# Patient Record
Sex: Female | Born: 1975 | Race: White | Hispanic: No | Marital: Married | Smoking: Former smoker
Health system: Southern US, Community
[De-identification: ages and names within clinical notes are randomized; demographics above are authoritative.]

## PROBLEM LIST (undated history)

## (undated) DIAGNOSIS — E785 Hyperlipidemia, unspecified: Secondary | ICD-10-CM

## (undated) DIAGNOSIS — M1990 Unspecified osteoarthritis, unspecified site: Secondary | ICD-10-CM

## (undated) DIAGNOSIS — G44229 Chronic tension-type headache, not intractable: Secondary | ICD-10-CM

## (undated) DIAGNOSIS — G43909 Migraine, unspecified, not intractable, without status migrainosus: Secondary | ICD-10-CM

## (undated) DIAGNOSIS — Z8619 Personal history of other infectious and parasitic diseases: Secondary | ICD-10-CM

## (undated) DIAGNOSIS — R519 Headache, unspecified: Secondary | ICD-10-CM

## (undated) DIAGNOSIS — N83202 Unspecified ovarian cyst, left side: Secondary | ICD-10-CM

## (undated) DIAGNOSIS — N390 Urinary tract infection, site not specified: Secondary | ICD-10-CM

## (undated) DIAGNOSIS — Z87891 Personal history of nicotine dependence: Secondary | ICD-10-CM

## (undated) DIAGNOSIS — Z8669 Personal history of other diseases of the nervous system and sense organs: Secondary | ICD-10-CM

## (undated) DIAGNOSIS — T7840XA Allergy, unspecified, initial encounter: Secondary | ICD-10-CM

## (undated) DIAGNOSIS — R51 Headache: Secondary | ICD-10-CM

## (undated) DIAGNOSIS — N83201 Unspecified ovarian cyst, right side: Secondary | ICD-10-CM

## (undated) DIAGNOSIS — F419 Anxiety disorder, unspecified: Secondary | ICD-10-CM

## (undated) HISTORY — DX: Unspecified ovarian cyst, right side: N83.201

## (undated) HISTORY — DX: Unspecified ovarian cyst, left side: N83.202

## (undated) HISTORY — DX: Unspecified osteoarthritis, unspecified site: M19.90

## (undated) HISTORY — DX: Headache, unspecified: R51.9

## (undated) HISTORY — DX: Hyperlipidemia, unspecified: E78.5

## (undated) HISTORY — PX: WISDOM TOOTH EXTRACTION: SHX21

## (undated) HISTORY — DX: Personal history of other diseases of the nervous system and sense organs: Z86.69

## (undated) HISTORY — DX: Allergy, unspecified, initial encounter: T78.40XA

## (undated) HISTORY — DX: Migraine, unspecified, not intractable, without status migrainosus: G43.909

## (undated) HISTORY — DX: Personal history of other infectious and parasitic diseases: Z86.19

## (undated) HISTORY — DX: Headache: R51

## (undated) HISTORY — DX: Urinary tract infection, site not specified: N39.0

## (undated) HISTORY — DX: Chronic tension-type headache, not intractable: G44.229

---

## 1998-02-11 ENCOUNTER — Emergency Department (HOSPITAL_COMMUNITY): Admission: EM | Admit: 1998-02-11 | Discharge: 1998-02-11 | Payer: Self-pay | Admitting: Emergency Medicine

## 2001-12-10 ENCOUNTER — Emergency Department (HOSPITAL_COMMUNITY): Admission: EM | Admit: 2001-12-10 | Discharge: 2001-12-11 | Payer: Self-pay | Admitting: Emergency Medicine

## 2001-12-11 ENCOUNTER — Emergency Department (HOSPITAL_COMMUNITY): Admission: EM | Admit: 2001-12-11 | Discharge: 2001-12-11 | Payer: Self-pay | Admitting: Emergency Medicine

## 2003-04-24 ENCOUNTER — Other Ambulatory Visit: Admission: RE | Admit: 2003-04-24 | Discharge: 2003-04-24 | Payer: Self-pay | Admitting: Family Medicine

## 2004-12-04 ENCOUNTER — Other Ambulatory Visit: Admission: RE | Admit: 2004-12-04 | Discharge: 2004-12-04 | Payer: Self-pay | Admitting: Family Medicine

## 2006-04-07 ENCOUNTER — Other Ambulatory Visit: Admission: RE | Admit: 2006-04-07 | Discharge: 2006-04-07 | Payer: Self-pay | Admitting: Family Medicine

## 2007-08-26 ENCOUNTER — Other Ambulatory Visit: Admission: RE | Admit: 2007-08-26 | Discharge: 2007-08-26 | Payer: Self-pay | Admitting: Family Medicine

## 2008-09-18 ENCOUNTER — Other Ambulatory Visit: Admission: RE | Admit: 2008-09-18 | Discharge: 2008-09-18 | Payer: Self-pay | Admitting: Family Medicine

## 2009-12-23 ENCOUNTER — Other Ambulatory Visit: Admission: RE | Admit: 2009-12-23 | Discharge: 2009-12-23 | Payer: Self-pay | Admitting: Family Medicine

## 2011-10-19 ENCOUNTER — Other Ambulatory Visit (HOSPITAL_COMMUNITY): Payer: Self-pay | Admitting: Family Medicine

## 2011-10-19 ENCOUNTER — Ambulatory Visit (HOSPITAL_COMMUNITY)
Admission: RE | Admit: 2011-10-19 | Discharge: 2011-10-19 | Disposition: A | Discharge status: Home / Self Care | Payer: BC Managed Care – PPO | Source: Ambulatory Visit | Attending: Family Medicine | Admitting: Family Medicine

## 2011-10-19 ENCOUNTER — Inpatient Hospital Stay (HOSPITAL_COMMUNITY)
Admission: AD | Admit: 2011-10-19 | Discharge: 2011-10-19 | Disposition: A | Discharge status: Home / Self Care | Payer: BC Managed Care – PPO | Source: Ambulatory Visit | Attending: Family Medicine | Admitting: Family Medicine

## 2011-10-19 DIAGNOSIS — Z3201 Encounter for pregnancy test, result positive: Secondary | ICD-10-CM

## 2011-10-19 DIAGNOSIS — N939 Abnormal uterine and vaginal bleeding, unspecified: Secondary | ICD-10-CM

## 2011-10-19 DIAGNOSIS — O209 Hemorrhage in early pregnancy, unspecified: Secondary | ICD-10-CM | POA: Insufficient documentation

## 2011-10-19 NOTE — MAU Note (Signed)
Radiology here for pt, was scheduled for outpt [place].

## 2011-10-19 NOTE — MAU Note (Signed)
Out pt only..on our board in error.

## 2011-11-18 ENCOUNTER — Other Ambulatory Visit: Payer: Self-pay | Admitting: Obstetrics and Gynecology

## 2013-03-28 ENCOUNTER — Other Ambulatory Visit: Payer: Self-pay | Admitting: Obstetrics and Gynecology

## 2013-05-25 ENCOUNTER — Encounter: Payer: Self-pay | Admitting: Neurology

## 2013-05-26 ENCOUNTER — Ambulatory Visit (INDEPENDENT_AMBULATORY_CARE_PROVIDER_SITE_OTHER): Payer: 59 | Admitting: Neurology

## 2013-05-26 ENCOUNTER — Encounter: Payer: Self-pay | Admitting: Neurology

## 2013-05-26 ENCOUNTER — Encounter (INDEPENDENT_AMBULATORY_CARE_PROVIDER_SITE_OTHER): Payer: Self-pay

## 2013-05-26 VITALS — BP 107/75 | HR 76 | Ht 64.0 in | Wt 149.0 lb

## 2013-05-26 DIAGNOSIS — R51 Headache: Secondary | ICD-10-CM

## 2013-05-26 DIAGNOSIS — R519 Headache, unspecified: Secondary | ICD-10-CM | POA: Insufficient documentation

## 2013-05-26 NOTE — Progress Notes (Signed)
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 PCP Amber Hendricks for evaluation of neck stiffness, headaches  She reported a history of occasionally headaches in the past, but only intermittent, over the past month, since February 2015, she has been having frequent almost daily right parietal area transient sharp pain, was no visual loss, short lasting  She also compalins of neck stiffness, radiating to bilateral shoulder,  She has been working out recently, also receiving acupuncture regularly, she used to take Xanax, now she was given clonazepam 0 point 5 mg 1 to have tablets as needed,   She also complains of stress, clonazepam does helps, but she wears off 2 hours later,  She also complains of episodes of bending over, during, she had fainting sensation,as if she is going to pass out.  She denies gait difficulty, no visual loss  REVIEW OF SYSTEMS: Full 14 system review of systems performed and notable only for anxiety, dizziness, headaches, memory loss, feeling cold, energy, constipation, spinning sensation, chest pain,  ALLERGIES: Allergies  Allergen Reactions  . Other     [place] Surgical lubricant----vaginal burning    HOME MEDICATIONS: Current Outpatient Prescriptions on File Prior to Visit  Medication Sig Dispense Refill  . acetaminophen (TYLENOL) 325 MG tablet Take 650 mg by mouth every 6 (six) hours as needed.      . cyclobenzaprine (FLEXERIL) 10 MG tablet Take 10 mg by mouth. 1/2 tablet twice a day as needed      . ibuprofen (ADVIL,MOTRIN) 200 MG tablet Take 200 mg by mouth every 6 (six) hours as needed.      . Prenatal Vit-Fe Fumarate-FA (PRENATAL COMPLETE PO) Take 1 tablet by mouth daily.         PAST MEDICAL HISTORY: Past Medical History  Diagnosis Date  . anxiety   . History of migraine headaches     Menstual- no aura  . Cysts of both ovaries     PAST SURGICAL HISTORY: Past Surgical  History  Procedure Laterality Date  . Wisdom tooth extraction      FAMILY HISTORY: Family History  Problem Relation Age of Onset  . Osteoarthritis Father     (neck and shoulders)  . Osteoporosis Mother   . Heart disease Paternal Grandfather   . Dementia Paternal Grandmother   . Diabetes Maternal Grandfather   . Heart disease Maternal Grandfather   . Colon cancer Maternal Uncle   . Dementia Paternal Grandfather     SOCIAL HISTORY:  History   Social History  . Marital Status: Married    Spouse Name: Amber Hendricks    Number of Children: 0  . 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 Smoking status: Former Games developermoker  . Smokeless tobacco: Never Used     Comment: Quit five years ago.  . Alcohol Use: Yes     Comment: 7 glasses of wine per week  . Drug Use: No  . Sexual Activity: Not on file   Other Topics Concern  . Not on file   Social History Narrative   Patient is married. ( Amber Hendricks)   Patient is a Veterinary surgeonrealtor.   Patient drinks 1-2 servings of soda daily.   College education       PHYSICAL EXAM   Filed Vitals:   05/26/13 1259  BP: 107/75  Pulse: 76  Height: 5\' 4"  (1.626 m)  Weight: 149 lb (67.586 kg)    Not recorded    Body mass index is 25.56 kg/(m^2).   Generalized: In no acute distress  Neck: Supple, no carotid bruits   Cardiac: Regular rate rhythm  Pulmonary: Clear to auscultation bilaterally  Musculoskeletal: No deformity  Neurological examination  Mentation: Alert oriented to time, place, history taking, and causual conversation  Cranial nerve II-XII: Pupils were equal round reactive to light. Extraocular movements were full.  Visual field were full on confrontational test. Bilateral fundi were sharp.  Facial sensation and strength were normal. Hearing was intact to finger rubbing bilaterally. Uvula tongue midline.  Head turning and shoulder shrug and were normal and  symmetric.Tongue protrusion into cheek strength was normal.  Motor: Normal tone, bulk and strength.  Sensory: Intact to fine touch, pinprick, preserved vibratory sensation, and proprioception at toes.  Coordination: Normal finger to nose, heel-to-shin bilaterally there was no truncal ataxia  Gait: Rising up from seated position without assistance, normal stance, without trunk ataxia, moderate stride, good arm swing, smooth turning, able to perform tiptoe, and heel walking without difficulty.   Romberg signs: Negative  Deep tendon reflexes: Brachioradialis 2/2, biceps 2/2, triceps 2/2, patellar 2/2, Achilles 2/2, plantar responses were flexor bilaterally.   DIAGNOSTIC DATA (LABS, IMAGING, TESTING) - I reviewed patient records, labs, notes, testing and imaging myself where available.  ASSESSMENT AND PLAN  Amber Hendricks is a 38 y.o. female with constellation of complaints detailed above, many upper and nuchal area pain, bilateral shoulder achiness constant daily intermittent short lasting sharp headaches,, normal neurological examination,  Most likely tension headache, cervicogenic headaches, we will proceed with MRI of the brain, I will call her report, she is to continue neck stretching exercise, heart compression, when necessary NSAIDs, only return to clinic for new issues   Levert Feinstein, M.D. Ph.D.  [facility] [address], [address] [place], [place] [address] [phone]

## 2013-09-05 ENCOUNTER — Encounter: Payer: Self-pay | Admitting: General Surgery

## 2013-09-05 DIAGNOSIS — R42 Dizziness and giddiness: Secondary | ICD-10-CM | POA: Insufficient documentation

## 2013-09-05 DIAGNOSIS — R079 Chest pain, unspecified: Secondary | ICD-10-CM

## 2013-09-05 DIAGNOSIS — R0789 Other chest pain: Secondary | ICD-10-CM | POA: Insufficient documentation

## 2013-09-12 ENCOUNTER — Encounter: Payer: Self-pay | Admitting: Cardiology

## 2013-09-12 ENCOUNTER — Ambulatory Visit (INDEPENDENT_AMBULATORY_CARE_PROVIDER_SITE_OTHER): Payer: 59 | Admitting: Cardiology

## 2013-09-12 VITALS — BP 112/74 | HR 62 | Ht 64.0 in | Wt 146.0 lb

## 2013-09-12 DIAGNOSIS — R42 Dizziness and giddiness: Secondary | ICD-10-CM

## 2013-09-12 DIAGNOSIS — R0789 Other chest pain: Secondary | ICD-10-CM

## 2013-09-12 NOTE — Progress Notes (Signed)
Admit date: (Not on file)   [address] [place], [place]  [address] Phone: [phone]) [phone] Fax:  [phone]  Date:  09/12/2013   ID:  Amber Hendricks, DOB 03/15/1976, MRN [id number]  PCP:  Amber DossWILLARD,JENNIFER, PA-C  Cardiologist:  Armanda Magicraci Turner, MD     History of Present Illness: [name] is a 38 y.o. female with a history of depression and migraine headaches presents today for evaluation of chest pain.  The chest pain started about 2 months ago but at the end of January she started having dizziness.  She started [place] in January along with acupuncture and it started after that.  She says that she feels lightheaded daily and was placed on Buspar but that did not help.  She says that the dizziness occurs when she does jazzercise and she will notice her heart racing and usually happens at peak intensity and then transitions to weight lifting.  The dizziness usually lasts about an hour.  She feels disoriented but has not passed out.  She says in [place] she felt like she was going to pass out.  Her BP normally runs low and is around 112mmHg systolic but got as low as 103mmHg during Jazzercise.  She stopped her Buspar because she thought she was pregnant and then had 2 panic attacks.  She has problems with anxiety as well.  She describes the chest pain as a dull ache that is over her left breast and into her left axilla that is also pressure like.  She had it for one week constant and never went away but recently it has improved.  She noticed SOB during the episode as well.  She denies any palpitations or LE edema.     Wt Readings from Last 3 Encounters:  09/12/13 146 lb (66.225 kg)  05/26/13 149 lb (67.586 kg)     Past Medical History  Diagnosis Date  . Depression   . History of migraine headaches     Menstual- no aura  . Cysts of both ovaries   . Chronic tension headaches     /cervicogenic HAs- Dr Terrace ArabiaYan consults 3/15    Current Outpatient Prescriptions  Medication Sig Dispense  Refill  . acetaminophen (TYLENOL) 325 MG tablet Take 650 mg by mouth every 6 (six) hours as needed.      Marland Kitchen. aspirin 325 MG tablet Take 325 mg by mouth daily.      . clonazePAM (KLONOPIN) 0.5 MG tablet Take 0.5 mg by mouth daily.      Marland Kitchen. ibuprofen (ADVIL,MOTRIN) 200 MG tablet Take 200 mg by mouth every 6 (six) hours as needed.      . Prenatal Vit-Fe Fumarate-FA (PRENATAL COMPLETE PO) Take 1 tablet by mouth daily.       No current facility-administered medications for this visit.    Allergies:    Allergies  Allergen Reactions  . Other     [place] Surgical lubricant----vaginal burning    Social History:  The patient  reports that she quit smoking about 4 years ago. She has never used smokeless tobacco. She reports that she drinks alcohol. She reports that she does not use illicit drugs.   Family History:  The patient's family history includes Colon cancer in her maternal uncle; Dementia in her paternal grandfather and paternal grandmother; Diabetes in her maternal grandfather; Heart disease in her maternal grandfather and paternal grandfather; Osteoarthritis in her father; Osteoporosis in her mother.   ROS:  Please see the history of present illness.  All other systems reviewed and negative.   PHYSICAL EXAM: VS:  BP 112/74  Pulse 62  Ht 5\' 4"  (1.626 m)  Wt 146 lb (66.225 kg)  BMI 25.05 kg/m2  SpO2 98% Well nourished, well developed, in no acute distress HEENT: normal Neck: no JVD Cardiac:  normal S1, S2; RRR; no murmur Lungs:  clear to auscultation bilaterally, no wheezing, rhonchi or rales Abd: soft, nontender, no hepatomegaly Ext: no edema Skin: warm and dry Neuro:  CNs 2-12 intact, no focal abnormalities noted  EKG:     Sinus bradycardia at 52bpm with normal QTc and no ST changes  ASSESSMENT AND PLAN:  1. Atypical chest pain that sounds more musculoskeletal.  She does not really have any CRF and EKG is nonischemic.   - ETT to rule out ischemia - 2D echo to assess  LVF 2. Dizziness with sensation that heart speeds up with exercise. - event monitor to rule out arrhythmia  PRN followup pending results of studies  Signed, Armanda Magicraci Turner, MD 09/12/2013 3:26 PM

## 2013-09-12 NOTE — Patient Instructions (Signed)
Your physician recommends that you continue on your current medications as directed. Please refer to the Current Medication list given to you today.  Your physician has requested that you have an echocardiogram. Echocardiography is a painless test that uses sound waves to create images of your heart. It provides your doctor with information about the size and shape of your heart and how well your heart's chambers and valves are working. This procedure takes approximately one hour. There are no restrictions for this procedure.  Your physician has requested that you have an exercise tolerance test. For further information please visit [email or web address]. Please also follow instruction sheet, as given.  Your physician has recommended that you wear an event monitor. Event monitors are medical devices that record the heart's electrical activity. Doctors most often us these monitors to diagnose arrhythmias. Arrhythmias are problems with the speed or rhythm of the heartbeat. The monitor is a small, portable device. You can wear one while you do your normal daily activities. This is usually used to diagnose what is causing palpitations/syncope (passing out).  Your physician recommends that you schedule a follow-up appointment As Needed

## 2013-09-14 ENCOUNTER — Encounter: Payer: Self-pay | Admitting: *Deleted

## 2013-09-14 ENCOUNTER — Encounter (INDEPENDENT_AMBULATORY_CARE_PROVIDER_SITE_OTHER): Payer: 59

## 2013-09-14 DIAGNOSIS — R42 Dizziness and giddiness: Secondary | ICD-10-CM

## 2013-09-14 NOTE — Progress Notes (Signed)
Patient ID: Amber Hendricks, female   DOB: 04/21/1975, 38 y.o.   MRN: [id number] [employer] 30 day cardiac event monitor applied to patient.

## 2013-10-02 ENCOUNTER — Ambulatory Visit (HOSPITAL_COMMUNITY): Payer: 59 | Attending: Cardiology | Admitting: Radiology

## 2013-10-02 DIAGNOSIS — R0789 Other chest pain: Secondary | ICD-10-CM

## 2013-10-02 DIAGNOSIS — R072 Precordial pain: Secondary | ICD-10-CM

## 2013-10-02 NOTE — Progress Notes (Signed)
Echocardiogram performed.  

## 2013-10-10 ENCOUNTER — Telehealth (HOSPITAL_COMMUNITY): Payer: Self-pay

## 2013-10-10 NOTE — Telephone Encounter (Signed)
Encounter complete. 

## 2013-10-11 ENCOUNTER — Telehealth: Payer: Self-pay | Admitting: Cardiology

## 2013-10-11 NOTE — Telephone Encounter (Signed)
Gave pt results for ECHO

## 2013-10-11 NOTE — Telephone Encounter (Signed)
New message ° ° ° °Patient calling for echo test results °

## 2013-10-12 ENCOUNTER — Ambulatory Visit (HOSPITAL_COMMUNITY)
Admission: RE | Admit: 2013-10-12 | Discharge: 2013-10-12 | Disposition: A | Discharge status: Home / Self Care | Payer: 59 | Source: Ambulatory Visit | Attending: Cardiology | Admitting: Cardiology

## 2013-10-12 DIAGNOSIS — R0789 Other chest pain: Secondary | ICD-10-CM

## 2013-10-12 NOTE — Procedures (Signed)
Exercise Treadmill Test  Test  Exercise Tolerance Test Ordering MD: Armanda Magicraci Turner    Unique Test No: 1   Treadmill:  1  Indication for ETT: chest pain - rule out ischemia  Contraindication to ETT: No   Stress Modality: exercise - treadmill  Cardiac Imaging Performed: non   Protocol: standard Bruce - maximal  Max BP:  155/74  Max MPHR (bpm):  184 85% MPR (bpm):  156  MPHR obtained (bpm):  169 % MPHR obtained:  91  Reached 85% MPHR (min:sec):  9:10 Total Exercise Time (min-sec):  10  Workload in METS:  11.7 Borg Scale: 15  Reason ETT Terminated:  dyspnea    ST Segment Analysis At Rest: NSR, RAE With Exercise: no evidence of significant ST depression  Other Information Arrhythmia:  No Angina during ETT:  absent (0) Quality of ETT:  diagnostic  ETT Interpretation:  normal - no evidence of ischemia by ST analysis  Comments: ETT with good exercise tolerance; no chest pain; no ST changes; negative adequate ETT.   Olga MillersBrian Viha Kriegel

## 2013-10-20 ENCOUNTER — Telehealth: Payer: Self-pay | Admitting: Cardiology

## 2013-10-20 NOTE — Telephone Encounter (Signed)
Please let patient know that heart monitor showed NSR with HR range from 48-122bpm and no arrhythmias

## 2013-10-23 NOTE — Telephone Encounter (Signed)
lmtrc

## 2013-10-27 ENCOUNTER — Encounter: Payer: Self-pay | Admitting: Cardiology

## 2013-10-27 NOTE — Telephone Encounter (Signed)
Advised patient

## 2013-10-27 NOTE — Telephone Encounter (Signed)
Patient calling back to get test results, please call and advise.

## 2013-10-27 NOTE — Telephone Encounter (Signed)
Patient already had open encounter for test results     This encounter was created in error - please disregard.

## 2014-01-22 ENCOUNTER — Telehealth: Payer: Self-pay | Admitting: Neurology

## 2014-01-22 NOTE — Telephone Encounter (Signed)
Patient calling to state that she has decided that she does want to have an MRI, please return call and advise.

## 2014-01-23 ENCOUNTER — Encounter: Payer: Self-pay | Admitting: Neurology

## 2014-01-23 ENCOUNTER — Ambulatory Visit (INDEPENDENT_AMBULATORY_CARE_PROVIDER_SITE_OTHER): Payer: 59 | Admitting: Neurology

## 2014-01-23 VITALS — BP 114/80 | HR 64 | Ht 64.0 in | Wt 148.0 lb

## 2014-01-23 DIAGNOSIS — H538 Other visual disturbances: Secondary | ICD-10-CM

## 2014-01-23 DIAGNOSIS — R51 Headache: Secondary | ICD-10-CM

## 2014-01-23 DIAGNOSIS — R519 Headache, unspecified: Secondary | ICD-10-CM

## 2014-01-23 NOTE — Progress Notes (Signed)
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 5 mg 1 to have tablets as needed,  She also complains of stress, clonazepam does helps, but she wears off 2 hours later,  She also complains of episodes of bending over, during, she had fainting sensation,as if she is going to pass out.  She denies gait difficulty, no visual loss.  UPDATE Nov 3rd 2015: Over the past couple weeks, she complains of right side blurry vision, eye pain, was evaluated by ophthalmologist, was diagnosed with right corneal disease,is receiving steroid eyedrop,  Over the past few days, she has almost constant vertex area pressure headaches, with mild light noise sensitivity, nauseous, 6 out of 10,  She denies gait difficulty, complains of neck pain, cracking sound when she turns her neck,   REVIEW OF SYSTEMS: Full 14 system review of systems performed and notable only for excessive sweating, right blurry vision, chest tightness, achy muscles, neck pain, dizziness, headaches, anxiety  ALLERGIES: Allergies  Allergen Reactions  . Other     [place] Surgical lubricant----vaginal burning    HOME MEDICATIONS: Current Outpatient Prescriptions on File Prior to Visit  Medication Sig Dispense Refill  . acetaminophen (TYLENOL) 325 MG tablet Take 650 mg by mouth every 6 (six) hours as needed.      . cyclobenzaprine (FLEXERIL) 10 MG tablet Take 10 mg by  mouth. 1/2 tablet twice a day as needed      . ibuprofen (ADVIL,MOTRIN) 200 MG tablet Take 200 mg by mouth every 6 (six) hours as needed.      . Prenatal Vit-Fe Fumarate-FA (PRENATAL COMPLETE PO) Take 1 tablet by mouth daily.         PAST MEDICAL HISTORY: Past Medical History  Diagnosis Date  . anxiety   . History of migraine headaches     Menstual- no aura  . Cysts of both ovaries     PAST SURGICAL HISTORY: Past Surgical History  Procedure Laterality Date  . Wisdom tooth extraction      FAMILY HISTORY: Family History  Problem Relation Age of Onset  . Osteoarthritis Father     (neck and shoulders)  . Osteoporosis Mother   . Heart disease Paternal Grandfather   . Dementia Paternal Grandmother   . Diabetes Maternal Grandfather   . Heart disease Maternal Grandfather   . Colon cancer Maternal Uncle   . Dementia Paternal Grandfather     SOCIAL HISTORY:  History   Social History  . Marital Status: Married    Spouse Name: Darren    Number of Children: 0  . 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 Smoking status: Former Games developermoker  . Smokeless tobacco: Never Used     Comment: Quit five years ago.  . Alcohol Use: Yes     Comment: 7 glasses of wine per week  . Drug Use:  No  . Sexual Activity: Not on file   Other Topics Concern  . Not on file   Social History Narrative   Patient is married. ( Darren)   Patient is a Veterinary surgeonrealtor.   Patient drinks 1-2 servings of soda daily.   College education       PHYSICAL EXAM   Filed Vitals:   01/23/14 1533  BP: 114/80  Pulse: 64  Height: 5\' 4"  (1.626 m)  Weight: 148 lb (67.132 kg)    Not recorded      Body mass index is 25.39 kg/(m^2).   Generalized: In no acute distress  Neck: Supple, no carotid bruits   Cardiac: Regular rate rhythm  Pulmonary: Clear to auscultation bilaterally  Musculoskeletal: No deformity  Neurological  examination  Mentation: Alert oriented to time, place, history taking, and causual conversation  Cranial nerve II-XII: Pupils were equal round reactive to light. Extraocular movements were full.  Visual field were full on confrontational test. Bilateral fundi were sharp.  Facial sensation and strength were normal. Hearing was intact to finger rubbing bilaterally. Uvula tongue midline.  Head turning and shoulder shrug and were normal and symmetric.Tongue protrusion into cheek strength was normal.  Motor: Normal tone, bulk and strength.  Sensory: Intact to fine touch, pinprick, preserved vibratory sensation, and proprioception at toes.  Coordination: Normal finger to nose, heel-to-shin bilaterally there was no truncal ataxia  Gait: Rising up from seated position without assistance, normal stance, without trunk ataxia, moderate stride, good arm swing, smooth turning, able to perform tiptoe, and heel walking without difficulty.   Romberg signs: Negative  Deep tendon reflexes: Brachioradialis 2/2, biceps 2/2, triceps 2/2, patellar 2/2, Achilles 2/2, plantar responses were flexor bilaterally.   DIAGNOSTIC DATA (LABS, IMAGING, TESTING) - I reviewed patient records, labs, notes, testing and imaging myself where available.  ASSESSMENT AND PLAN  Catheryn Ivar Hendricks is a 38 y.o. female with constellation of complaints detailed above, upper and nuchal area pain, vertex area headache, bilateral shoulder achiness constant daily intermittent short lasting sharp headaches, also recent onset right blurry vision, normal neurological examination.  We will proceed with MRI of the brain, I will call her MRI report I will call her report, she is to continue neck stretching exercise, hot compression, when necessary NSAIDs, only return to clinic for new issues   Levert FeinsteinYijun Kaleah Hagemeister, M.D. Ph.D.  [facility] [address], [address] [place], [place] [address] [phone]

## 2014-01-25 NOTE — Telephone Encounter (Signed)
I have called Amber Hendricks,   She wants to have MRI brain as ordered.  She will also follow up with Neurosurgeon Dr. Ellwood HandlerBertero.   I will call her MRI result.

## 2014-03-23 NOTE — L&D Delivery Note (Signed)
Operative Delivery Note At 12:25 AM a viable female was delivered via Vaginal, Spontaneous Delivery.  Presentation: vertex; Position: Right,, Occiput,, Anterior; Station: +3.  Verbal consent: obtained from patient.  Risks and benefits discussed in detail.  Risks include, but are not limited to the risks of anesthesia, bleeding, infection, damage to maternal tissues, fetal cephalhematoma.  There is also the risk of inability to effect vaginal delivery of the head, or shoulder dystocia that cannot be resolved by established maneuvers, leading to the need for emergency cesarean section.  APGAR:9 9  weight  pending .   Placenta status:spontaneously with 3 vessel cord and tight nuchal cord x 1 , .   Cord:  with the following complications: .  Cord pH: not obtained  Anesthesia:  epidural Instruments: mushroom delivered with 4th uc 1 popoff Mild shoulder dystocia relieved with : 1. Mcroberts maneuver 2.  Mild suprapubic pressure 3.  Wood's screw Moving both arms normally after delivery. Episiotomy:  none Lacerations:  2nd Suture Repair: 3.0 chromic Est. Blood Loss (mL):  308  Mom to postpartum.  Baby to Couplet care / Skin to Skin.  Jadah Bobak L 03/13/2015, 12:39 AM

## 2014-05-24 ENCOUNTER — Other Ambulatory Visit: Payer: Self-pay | Admitting: Obstetrics and Gynecology

## 2014-05-25 LAB — CYTOLOGY - PAP

## 2014-08-08 LAB — OB RESULTS CONSOLE ABO/RH: RH TYPE: POSITIVE

## 2014-08-08 LAB — OB RESULTS CONSOLE HEPATITIS B SURFACE ANTIGEN: Hepatitis B Surface Ag: NEGATIVE

## 2014-08-08 LAB — OB RESULTS CONSOLE RUBELLA ANTIBODY, IGM: Rubella: NON-IMMUNE/NOT IMMUNE

## 2014-08-08 LAB — OB RESULTS CONSOLE GC/CHLAMYDIA
CHLAMYDIA, DNA PROBE: NEGATIVE
GC PROBE AMP, GENITAL: NEGATIVE

## 2014-08-08 LAB — OB RESULTS CONSOLE HIV ANTIBODY (ROUTINE TESTING): HIV: NONREACTIVE

## 2014-08-08 LAB — OB RESULTS CONSOLE RPR: RPR: NONREACTIVE

## 2014-08-08 LAB — OB RESULTS CONSOLE ANTIBODY SCREEN: Antibody Screen: NEGATIVE

## 2015-02-11 LAB — OB RESULTS CONSOLE GBS: STREP GROUP B AG: NEGATIVE

## 2015-03-12 ENCOUNTER — Inpatient Hospital Stay (HOSPITAL_COMMUNITY): Payer: BLUE CROSS/BLUE SHIELD | Admitting: Anesthesiology

## 2015-03-12 ENCOUNTER — Encounter (HOSPITAL_COMMUNITY): Payer: Self-pay | Admitting: *Deleted

## 2015-03-12 ENCOUNTER — Inpatient Hospital Stay (HOSPITAL_COMMUNITY)
Admission: AD | Admit: 2015-03-12 | Discharge: 2015-03-14 | DRG: 775 | Disposition: A | Discharge status: Home / Self Care | Payer: BLUE CROSS/BLUE SHIELD | Source: Ambulatory Visit | Attending: Obstetrics and Gynecology | Admitting: Obstetrics and Gynecology

## 2015-03-12 DIAGNOSIS — Z3483 Encounter for supervision of other normal pregnancy, third trimester: Secondary | ICD-10-CM | POA: Diagnosis present

## 2015-03-12 DIAGNOSIS — IMO0001 Reserved for inherently not codable concepts without codable children: Secondary | ICD-10-CM

## 2015-03-12 DIAGNOSIS — Z3A39 39 weeks gestation of pregnancy: Secondary | ICD-10-CM

## 2015-03-12 DIAGNOSIS — Z23 Encounter for immunization: Secondary | ICD-10-CM

## 2015-03-12 HISTORY — DX: Personal history of nicotine dependence: Z87.891

## 2015-03-12 HISTORY — DX: Anxiety disorder, unspecified: F41.9

## 2015-03-12 LAB — TYPE AND SCREEN
ABO/RH(D): O POS
Antibody Screen: NEGATIVE

## 2015-03-12 LAB — ABO/RH: ABO/RH(D): O POS

## 2015-03-12 LAB — CBC
HCT: 35.5 % — ABNORMAL LOW (ref 36.0–46.0)
Hemoglobin: 12.4 g/dL (ref 12.0–15.0)
MCH: 32 pg (ref 26.0–34.0)
MCHC: 34.9 g/dL (ref 30.0–36.0)
MCV: 91.5 fL (ref 78.0–100.0)
PLATELETS: 218 10*3/uL (ref 150–400)
RBC: 3.88 MIL/uL (ref 3.87–5.11)
RDW: 13.9 % (ref 11.5–15.5)
WBC: 11.2 10*3/uL — ABNORMAL HIGH (ref 4.0–10.5)

## 2015-03-12 MED ORDER — FLEET ENEMA 7-19 GM/118ML RE ENEM: 1.0000 | ENEMA | RECTAL | Status: DC | PRN

## 2015-03-12 MED ORDER — OXYCODONE-ACETAMINOPHEN 5-325 MG PO TABS: 2.0000 | ORAL_TABLET | ORAL | Status: DC | PRN

## 2015-03-12 MED ORDER — ONDANSETRON HCL 4 MG/2ML IJ SOLN
4.0000 mg | Freq: Four times a day (QID) | INTRAMUSCULAR | Status: DC | PRN
  Administered 2015-03-12: 4 mg via INTRAVENOUS
  Filled 2015-03-12: qty 2

## 2015-03-12 MED ORDER — FENTANYL 2.5 MCG/ML BUPIVACAINE 1/10 % EPIDURAL INFUSION (WH - ANES)
14.0000 mL/h | INTRAMUSCULAR | Status: DC | PRN
  Administered 2015-03-12 (×2): 14 mL/h via EPIDURAL
  Filled 2015-03-12 (×2): qty 125

## 2015-03-12 MED ORDER — OXYCODONE-ACETAMINOPHEN 5-325 MG PO TABS: 1.0000 | ORAL_TABLET | ORAL | Status: DC | PRN

## 2015-03-12 MED ORDER — PHENYLEPHRINE 40 MCG/ML (10ML) SYRINGE FOR IV PUSH (FOR BLOOD PRESSURE SUPPORT)
80.0000 ug | PREFILLED_SYRINGE | INTRAVENOUS | Status: DC | PRN
  Filled 2015-03-12: qty 20

## 2015-03-12 MED ORDER — OXYTOCIN 40 UNITS IN LACTATED RINGERS INFUSION - SIMPLE MED
1.0000 m[IU]/min | INTRAVENOUS | Status: DC
  Administered 2015-03-12: 2 m[IU]/min via INTRAVENOUS
  Filled 2015-03-12: qty 1000

## 2015-03-12 MED ORDER — DIPHENHYDRAMINE HCL 50 MG/ML IJ SOLN: 12.5000 mg | INTRAMUSCULAR | Status: DC | PRN

## 2015-03-12 MED ORDER — TERBUTALINE SULFATE 1 MG/ML IJ SOLN: 0.2500 mg | Freq: Once | INTRAMUSCULAR | Status: DC | PRN

## 2015-03-12 MED ORDER — EPHEDRINE 5 MG/ML INJ: 10.0000 mg | INTRAVENOUS | Status: DC | PRN

## 2015-03-12 MED ORDER — CITRIC ACID-SODIUM CITRATE 334-500 MG/5ML PO SOLN
30.0000 mL | ORAL | Status: DC | PRN
  Administered 2015-03-12: 30 mL via ORAL
  Filled 2015-03-12: qty 15

## 2015-03-12 MED ORDER — OXYTOCIN BOLUS FROM INFUSION
500.0000 mL | INTRAVENOUS | Status: DC
  Administered 2015-03-13: 500 mL via INTRAVENOUS

## 2015-03-12 MED ORDER — LACTATED RINGERS IV SOLN
INTRAVENOUS | Status: DC
  Administered 2015-03-12: 21:00:00 via INTRAVENOUS

## 2015-03-12 MED ORDER — LIDOCAINE HCL (PF) 1 % IJ SOLN
INTRAMUSCULAR | Status: DC | PRN
  Administered 2015-03-12 (×2): 4 mL via EPIDURAL

## 2015-03-12 MED ORDER — ACETAMINOPHEN 325 MG PO TABS: 650.0000 mg | ORAL_TABLET | ORAL | Status: DC | PRN

## 2015-03-12 MED ORDER — OXYTOCIN 40 UNITS IN LACTATED RINGERS INFUSION - SIMPLE MED
62.5000 mL/h | INTRAVENOUS | Status: DC
  Administered 2015-03-13: 62.5 mL/h via INTRAVENOUS

## 2015-03-12 MED ORDER — LIDOCAINE HCL (PF) 1 % IJ SOLN
30.0000 mL | INTRAMUSCULAR | Status: DC | PRN
  Filled 2015-03-12: qty 30

## 2015-03-12 MED ORDER — LACTATED RINGERS IV SOLN
500.0000 mL | INTRAVENOUS | Status: DC | PRN
  Administered 2015-03-12: 500 mL via INTRAVENOUS

## 2015-03-12 NOTE — Anesthesia Procedure Notes (Signed)
Epidural Patient location during procedure: OB Start time: 03/12/2015 3:52 PM End time: 03/12/2015 3:59 PM  Staffing Anesthesiologist: Shona SimpsonHOLLIS, Dhyana Bastone D Performed by: anesthesiologist   Preanesthetic Checklist Completed: patient identified, site marked, surgical consent, pre-op evaluation, timeout performed, IV checked, risks and benefits discussed and monitors and equipment checked  Epidural Patient position: sitting Prep: ChloraPrep Patient monitoring: heart rate, continuous pulse ox and blood pressure Approach: midline Location: L3-L4 Injection technique: LOR saline  Needle:  Needle type: Tuohy  Needle gauge: 17 G Needle length: 9 cm Catheter type: closed end flexible Catheter size: 20 Guage Test dose: negative and 1.5% lidocaine  Assessment Events: blood not aspirated, injection not painful, no injection resistance and no paresthesia  Additional Notes LOR @ 6  Patient identified. Risks/Benefits/Options discussed with patient including but not limited to bleeding, infection, nerve damage, paralysis, failed block, incomplete pain control, headache, blood pressure changes, nausea, vomiting, reactions to medications, itching and postpartum back pain. Confirmed with bedside nurse the patient's most recent platelet count. Confirmed with patient that they are not currently taking any anticoagulation, have any bleeding history or any family history of bleeding disorders. Patient expressed understanding and wished to proceed. All questions were answered. Sterile technique was used throughout the entire procedure. Please see nursing notes for vital signs. Test dose was given through epidural catheter and negative prior to continuing to dose epidural or start infusion. Warning signs of high block given to the patient including shortness of breath, tingling/numbness in hands, complete motor block, or any concerning symptoms with instructions to call for help. Patient was given instructions on  fall risk and not to get out of bed. All questions and concerns addressed with instructions to call with any issues or inadequate analgesia.    Reason for block:procedure for pain

## 2015-03-12 NOTE — Anesthesia Preprocedure Evaluation (Signed)
Anesthesia Evaluation  Patient identified by MRN, date of birth, ID band Patient awake    Reviewed: Allergy & Precautions, NPO status , Patient's Chart, lab work & pertinent test results  Airway Mallampati: II  TM Distance: >3 FB Neck ROM: Full    Dental  (+) Teeth Intact   Pulmonary former smoker,    breath sounds clear to auscultation       Cardiovascular negative cardio ROS   Rhythm:Regular Rate:Normal     Neuro/Psych  Headaches, PSYCHIATRIC DISORDERS Anxiety    GI/Hepatic negative GI ROS, Neg liver ROS,   Endo/Other  negative endocrine ROS  Renal/GU negative Renal ROS  negative genitourinary   Musculoskeletal negative musculoskeletal ROS (+)   Abdominal   Peds negative pediatric ROS (+)  Hematology negative hematology ROS (+)   Anesthesia Other Findings   Reproductive/Obstetrics (+) Pregnancy                             Lab Results  Component Value Date   WBC 11.2* 03/12/2015   HGB 12.4 03/12/2015   HCT 35.5* 03/12/2015   MCV 91.5 03/12/2015   PLT 218 03/12/2015   No results found for: INR, PROTIME   Anesthesia Physical Anesthesia Plan  ASA: II  Anesthesia Plan: Epidural   Post-op Pain Management:    Induction:   Airway Management Planned:   Additional Equipment:   Intra-op Plan:   Post-operative Plan:   Informed Consent: I have reviewed the patients History and Physical, chart, labs and discussed the procedure including the risks, benefits and alternatives for the proposed anesthesia with the patient or authorized representative who has indicated his/her understanding and acceptance.     Plan Discussed with:   Anesthesia Plan Comments:         Anesthesia Quick Evaluation

## 2015-03-12 NOTE — H&P (Signed)
Amber Hendricks is a 39 year old G 1 P 0 at 1939 w 1 day presents in labor.  Waiting on L and D bed.  Contractions are getting stronger.  Maternal Medical History:  Reason for admission: Contractions.   Fetal activity: Perceived fetal activity is normal.      OB History    Gravida Para Term Preterm AB TAB SAB Ectopic Multiple Living   3    2  2    0     Past Medical History  Diagnosis Date  . History of migraine headaches     Menstual- no aura  . Cysts of both ovaries   . 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 Anxiety    Past Surgical History  Procedure Laterality Date  . Wisdom tooth extraction     Family History: family history includes Colon cancer in her maternal uncle; Dementia in her paternal grandfather and paternal grandmother; Diabetes in her maternal grandfather; Heart disease in her maternal grandfather and paternal grandfather; Osteoarthritis in her father; Osteoporosis in her mother. Social History:  reports that she quit smoking about 5 years ago. She has never used smokeless tobacco. She reports that she drinks alcohol. She reports that she does not use illicit drugs.   Prenatal Transfer Tool  Maternal Diabetes: No Genetic Screening: Normal Maternal Ultrasounds/Referrals: Normal Fetal Ultrasounds or other Referrals:  None Maternal Substance Abuse:  No Significant Maternal Medications:  None Significant Maternal Lab Results:  None Other Comments:  None  Review of Systems  All other systems reviewed and are negative.   Dilation: 2.5 Effacement (%): 70 Station: -2 Exam by:: L. Paschal, RN  Blood pressure 113/79, pulse 75, temperature 98.1 F (36.7 C), temperature source Oral, resp. rate 18, SpO2 98 %. Maternal Exam:  Abdomen: Fetal presentation: vertex     Fetal Exam Fetal State Assessment: Category I - tracings are normal.     Physical Exam  Nursing note and vitals reviewed. Constitutional: She appears  well-developed and well-nourished.  HENT:  Head: Normocephalic.  Neck: Normal range of motion.  Cardiovascular: Normal rate and regular rhythm.   Respiratory: Effort normal.  GI: Soft.    Prenatal labs: ABO, Rh: O/Positive/-- (05/18 0000) Antibody: Negative (05/18 0000) Rubella: Nonimmune (05/18 0000) RPR: Nonreactive (05/18 0000)  HBsAg: Negative (05/18 0000)  HIV: Non-reactive (05/18 0000)  GBS: Negative (11/21 0000)   Assessment/Plan: IUP at 39 w 1 day Labor Admit  Anticipate NSVD Epidural   Amber Hendricks L 03/12/2015, 1:55 PM

## 2015-03-12 NOTE — MAU Note (Signed)
Pt states she was in the office yesterday.  Grewal said she was 2 cm/80 per pt.  MD striped her membranes yesterday.  Started this morning with brownish discharge.   Denies ROM.  Good fetal movement.

## 2015-03-13 ENCOUNTER — Encounter (HOSPITAL_COMMUNITY): Payer: Self-pay | Admitting: *Deleted

## 2015-03-13 LAB — CBC
HCT: 30.2 % — ABNORMAL LOW (ref 36.0–46.0)
HEMOGLOBIN: 10.2 g/dL — AB (ref 12.0–15.0)
MCH: 31.2 pg (ref 26.0–34.0)
MCHC: 33.8 g/dL (ref 30.0–36.0)
MCV: 92.4 fL (ref 78.0–100.0)
Platelets: 182 10*3/uL (ref 150–400)
RBC: 3.27 MIL/uL — AB (ref 3.87–5.11)
RDW: 14 % (ref 11.5–15.5)
WBC: 17.1 10*3/uL — AB (ref 4.0–10.5)

## 2015-03-13 LAB — RPR: RPR Ser Ql: NONREACTIVE

## 2015-03-13 MED ORDER — WITCH HAZEL-GLYCERIN EX PADS: 1.0000 "application " | MEDICATED_PAD | CUTANEOUS | Status: DC | PRN

## 2015-03-13 MED ORDER — MEASLES, MUMPS & RUBELLA VAC ~~LOC~~ INJ
0.5000 mL | INJECTION | Freq: Once | SUBCUTANEOUS | Status: AC
  Administered 2015-03-14: 0.5 mL via SUBCUTANEOUS
  Filled 2015-03-13 (×2): qty 0.5

## 2015-03-13 MED ORDER — DIBUCAINE 1 % RE OINT
1.0000 "application " | TOPICAL_OINTMENT | RECTAL | Status: DC | PRN
  Filled 2015-03-13: qty 28

## 2015-03-13 MED ORDER — SIMETHICONE 80 MG PO CHEW: 80.0000 mg | CHEWABLE_TABLET | ORAL | Status: DC | PRN

## 2015-03-13 MED ORDER — MEDROXYPROGESTERONE ACETATE 150 MG/ML IM SUSP: 150.0000 mg | INTRAMUSCULAR | Status: DC | PRN

## 2015-03-13 MED ORDER — BENZOCAINE-MENTHOL 20-0.5 % EX AERO
1.0000 "application " | INHALATION_SPRAY | CUTANEOUS | Status: DC | PRN
  Administered 2015-03-13: 1 via TOPICAL
  Filled 2015-03-13 (×3): qty 56

## 2015-03-13 MED ORDER — ONDANSETRON HCL 4 MG PO TABS: 4.0000 mg | ORAL_TABLET | ORAL | Status: DC | PRN

## 2015-03-13 MED ORDER — ACETAMINOPHEN 325 MG PO TABS: 650.0000 mg | ORAL_TABLET | ORAL | Status: DC | PRN

## 2015-03-13 MED ORDER — BISACODYL 10 MG RE SUPP
10.0000 mg | Freq: Every day | RECTAL | Status: DC | PRN
  Filled 2015-03-13: qty 1

## 2015-03-13 MED ORDER — IBUPROFEN 600 MG PO TABS
600.0000 mg | ORAL_TABLET | Freq: Four times a day (QID) | ORAL | Status: DC
  Administered 2015-03-13 – 2015-03-14 (×6): 600 mg via ORAL
  Filled 2015-03-13 (×6): qty 1

## 2015-03-13 MED ORDER — DIPHENHYDRAMINE HCL 25 MG PO CAPS: 25.0000 mg | ORAL_CAPSULE | Freq: Four times a day (QID) | ORAL | Status: DC | PRN

## 2015-03-13 MED ORDER — OXYCODONE-ACETAMINOPHEN 5-325 MG PO TABS: 2.0000 | ORAL_TABLET | ORAL | Status: DC | PRN

## 2015-03-13 MED ORDER — TETANUS-DIPHTH-ACELL PERTUSSIS 5-2.5-18.5 LF-MCG/0.5 IM SUSP
0.5000 mL | Freq: Once | INTRAMUSCULAR | Status: DC
  Filled 2015-03-13: qty 0.5

## 2015-03-13 MED ORDER — LANOLIN HYDROUS EX OINT: TOPICAL_OINTMENT | CUTANEOUS | Status: DC | PRN

## 2015-03-13 MED ORDER — FLEET ENEMA 7-19 GM/118ML RE ENEM: 1.0000 | ENEMA | Freq: Every day | RECTAL | Status: DC | PRN

## 2015-03-13 MED ORDER — ZOLPIDEM TARTRATE 5 MG PO TABS: 5.0000 mg | ORAL_TABLET | Freq: Every evening | ORAL | Status: DC | PRN

## 2015-03-13 MED ORDER — SENNOSIDES-DOCUSATE SODIUM 8.6-50 MG PO TABS
2.0000 | ORAL_TABLET | ORAL | Status: DC
  Administered 2015-03-13: 2 via ORAL
  Filled 2015-03-13: qty 2

## 2015-03-13 MED ORDER — PRENATAL MULTIVITAMIN CH
1.0000 | ORAL_TABLET | Freq: Every day | ORAL | Status: DC
  Administered 2015-03-13 – 2015-03-14 (×2): 1 via ORAL
  Filled 2015-03-13 (×2): qty 1

## 2015-03-13 MED ORDER — OXYCODONE-ACETAMINOPHEN 5-325 MG PO TABS: 1.0000 | ORAL_TABLET | ORAL | Status: DC | PRN

## 2015-03-13 MED ORDER — ONDANSETRON HCL 4 MG/2ML IJ SOLN: 4.0000 mg | INTRAMUSCULAR | Status: DC | PRN

## 2015-03-13 NOTE — Progress Notes (Signed)
Post Partum Day 0 Subjective: no complaints, up ad lib, voiding and tolerating PO  Objective: Blood pressure 108/62, pulse 61, temperature 98.6 F (37 C), temperature source Oral, resp. rate 18, height 5\' 4"  (1.626 m), weight 178 lb (80.74 kg), SpO2 97 %, unknown if currently breastfeeding.  Physical Exam:  General: alert and cooperative Lochia: appropriate Uterine Fundus: firm Incision: healing well DVT Evaluation: No evidence of DVT seen on physical exam. Negative Homan's sign. No cords or calf tenderness. No significant calf/ankle edema.   Recent Labs  03/12/15 1450 03/13/15 0640  HGB 12.4 10.2*  HCT 35.5* 30.2*    Assessment/Plan: Plan for discharge tomorrow   LOS: 1 day   Jimmey Hengel G 03/13/2015, 9:13 AM

## 2015-03-13 NOTE — Anesthesia Postprocedure Evaluation (Signed)
Anesthesia Post Note  Patient: Vevelyn Francoismy Bolduc  Procedure(s) Performed: * No procedures listed *  Patient location during evaluation: Mother Baby Anesthesia Type: Epidural Level of consciousness: awake and alert and oriented Pain management: pain level controlled Vital Signs Assessment: post-procedure vital signs reviewed and stable Respiratory status: spontaneous breathing and respiratory function stable Cardiovascular status: blood pressure returned to baseline Postop Assessment: no headache, no backache, patient able to bend at knees, no signs of nausea or vomiting and adequate PO intake Anesthetic complications: no    Last Vitals:  Filed Vitals:   03/13/15 0350 03/13/15 0450  BP: 119/72 108/62  Pulse: 87 61  Temp: 37 C   Resp: 18 18    Last Pain:  Filed Vitals:   03/13/15 0556  PainSc: 1                  Matan Steen

## 2015-03-13 NOTE — Lactation Note (Signed)
This note was copied from the chart of Amber Arthurine Ivar DrapeWillie. Lactation Consultation Note  Initial visit made. Breastfeeding consultation services and support information given to patient.  Parents took breastfeeding classes.  Mom states baby has been latching easily.  C/o slight soreness on left nipple.  Nipple intact and slightly red.  Baby is cueing.  Assisted with positioning baby skin to skin in football hold.  Instructed on hand expression and large drop of colostrum obtained.  Baby eager to latch.  Reminded mom to wait for wide open mouth then bring baby quickly to breast.  Baby latches well and nurses actively.  She does some pulling back during feeding.   Instructed to use breast massage during feeding to increase flow and intake.  Reviewed basics and answered questions.  Encouraged to call with concerns/assist.  Patient Name: Amber Hendricks WRUEA'VToday's Date: 03/13/2015 Reason for consult: Initial assessment   Maternal Data Formula Feeding for Exclusion: No Has patient been taught Hand Expression?: Yes Does the patient have breastfeeding experience prior to this delivery?: No  Feeding Feeding Type: Breast Fed Length of feed: 15 min  LATCH Score/Interventions Latch: Grasps breast easily, tongue down, lips flanged, rhythmical sucking. Intervention(s): Adjust position;Assist with latch;Breast massage;Breast compression  Audible Swallowing: A few with stimulation Intervention(s): Skin to skin;Hand expression;Alternate breast massage  Type of Nipple: Everted at rest and after stimulation  Comfort (Breast/Nipple): Soft / non-tender     Hold (Positioning): Assistance needed to correctly position infant at breast and maintain latch. Intervention(s): Breastfeeding basics reviewed;Support Pillows;Position options;Skin to skin  LATCH Score: 8  Lactation Tools Discussed/Used     Consult Status Consult Status: Follow-up Date: 03/14/15 Follow-up type: In-patient    Huston FoleyMOULDEN, Edis Huish  S 03/13/2015, 2:08 PM

## 2015-03-14 MED ORDER — OXYCODONE-ACETAMINOPHEN 5-325 MG PO TABS: 1.0000 | ORAL_TABLET | ORAL | Status: DC | PRN

## 2015-03-14 MED ORDER — IBUPROFEN 600 MG PO TABS: 600.0000 mg | ORAL_TABLET | Freq: Four times a day (QID) | ORAL | Status: DC

## 2015-03-14 NOTE — Progress Notes (Signed)
MOB was referred for history of depression/anxiety. * Referral screened out by Clinical Social Worker because none of the following criteria appear to apply: ~ History of anxiety/depression during this pregnancy, or of post-partum depression. ~ Diagnosis of anxiety and/or depression within last 3 years OR * MOB's symptoms currently being treated with medication and/or therapy. Please contact the Clinical Social Worker if needs arise, or if MOB requests.  PNR notes appropriate situational Anxiety.  No concerns noted by staff.

## 2015-03-14 NOTE — Lactation Note (Signed)
This note was copied from the chart of Amber Hendricks. Lactation Consultation Note  Patient Name: Amber Vevelyn Francoismy Cartaya ZOXWR'UToday's Date: 03/14/2015 Reason for consult: Follow-up assessment;Difficult latch  Baby 34 hours old. Mom upset and requesting additional LC assistance. Mom states that she has nursed baby using NS but baby cueing to nurse when this LC entered room. Assisted mom with latching baby left breast using NS. Mom is using shield because her nipples are sore--especially her left. Baby nursed for 20 minutes without any swallows or colostrum in NS noted. Assisted parents to hand express about 3 ml of colostrum and FOB spoon-fed baby. Baby tolerated well and seemed satisfied. Assisted mom to use DEBP. Mom wanted assistance with using hospital pump in preparation for rental. Mom able to pump and hand express an additional 3 ml of EBM. Mom exhausted, but not wanting to introduce formula. Parents given supplementation guidelines with review and parents enc to pump and hand express several more times to see if supply increasing. But then parents strongly enc to add formula as needed, using guidelines.   Parents given written plan to put baby to breast with cues and every 2-3 hours if not cueing. Enc parents to use NS and supplement either with spoon or curve-tipped syringe in shield to keep baby at breast. Enc mom to post-pump for 15 minutes followed by hand expression. EBM storage guidelines reviewed. Parents given monthly pump rental, aware of OP/BFSG and LC phone line assistance after D/C.   Discussed that NS a temporary device and the need to pump while using shield. Mom has comfort gels, and use and cleaning reviewed.   Discussed assessment and interventions with patient's nurse Tammy, RN.  Maternal Data Has patient been taught Hand Expression?: Yes Does the patient have breastfeeding experience prior to this delivery?: No  Feeding Feeding Type: Breast Fed Length of feed: 20 min  LATCH  Score/Interventions Latch: Grasps breast easily, tongue down, lips flanged, rhythmical sucking. Intervention(s): Adjust position;Assist with latch  Audible Swallowing: None Intervention(s): Skin to skin;Hand expression Intervention(s): Skin to skin;Hand expression;Alternate breast massage  Type of Nipple: Everted at rest and after stimulation  Comfort (Breast/Nipple): Engorged, cracked, bleeding, large blisters, severe discomfort Intervention(s): Expressed breast milk to nipple;Double electric pump  Problem noted: Cracked, bleeding, blisters, bruises Interventions  (Cracked/bleeding/bruising/blister): Expressed breast milk to nipple;Double electric pump  Hold (Positioning): Assistance needed to correctly position infant at breast and maintain latch. Intervention(s): Breastfeeding basics reviewed;Support Pillows;Position options;Skin to skin  LATCH Score: 5  Lactation Tools Discussed/Used Nipple shield size: 20 Pump Review: Setup, frequency, and cleaning;Milk Storage Initiated by:: JW Date initiated:: 03/15/15   Consult Status Consult Status: PRN    Geralynn OchsWILLIARD, Dana Debo 03/14/2015, 11:15 AM

## 2015-03-14 NOTE — Lactation Note (Signed)
This note was copied from the chart of Amber Sanyiah Ivar DrapeWillie. Lactation Consultation Note Mom tearful, baby had been fussy. Moms nipple hurting especially Lt. Nipple. Has comfort gels, instructed her and applied. Rt. Nipple w/positional stripe, bruised and raw looking. Assisted baby in football hold to Rt. Breast. Baby latches well, but bites and pulls back, very painful to mom. Fitted mom w/#20NS, mom stated it felt much better. Discussing positioning, cluster feeding, I&O, supply and demand. Reviewed baby and me book w/latching, and how much should have as output. Comforted mom, answering questions. Reported to on coming LC. Patient Name: Amber Hendricks ZOXWR'UToday's Date: 03/14/2015 Reason for consult: Breast/nipple pain   Maternal Data    Feeding Feeding Type: Breast Fed Length of feed: 15 min (still BF)  LATCH Score/Interventions Latch: Repeated attempts needed to sustain latch, nipple held in mouth throughout feeding, stimulation needed to elicit sucking reflex. Intervention(s): Adjust position;Assist with latch;Breast massage;Breast compression  Audible Swallowing: A few with stimulation Intervention(s): Skin to skin;Hand expression;Alternate breast massage  Type of Nipple: Everted at rest and after stimulation  Comfort (Breast/Nipple): Engorged, cracked, bleeding, large blisters, severe discomfort Problem noted: Cracked, bleeding, blisters, bruises  Problem noted: Cracked, bleeding, blisters, bruises Interventions  (Cracked/bleeding/bruising/blister): Expressed breast milk to nipple  Hold (Positioning): Assistance needed to correctly position infant at breast and maintain latch. Intervention(s): Breastfeeding basics reviewed;Support Pillows;Position options;Skin to skin  LATCH Score: 5  Lactation Tools Discussed/Used Tools: Nipple Shields;Comfort gels Nipple shield size: 20   Consult Status Consult Status: Follow-up Date: 03/14/15 Follow-up type: In-patient    Charyl DancerCARVER,  Declan Mier G 03/14/2015, 7:57 AM

## 2015-03-14 NOTE — Progress Notes (Signed)
Discharge teaching completed with patient and significant other both verbalizing an understanding of instructions. Patient will follow up as instructed per care provider. Patient and s/o are aware of s/s to report and medications. Patient states she is ready to go home. Discharged in ambulatory condition with infant and s/o.

## 2015-03-14 NOTE — Discharge Summary (Signed)
Obstetric Discharge Summary Reason for Admission: onset of labor Prenatal Procedures: ultrasound Intrapartum Procedures: spontaneous vaginal delivery Postpartum Procedures: none Complications-Operative and Postpartum: 2 degree perineal laceration HEMOGLOBIN  Date Value Ref Range Status  03/13/2015 10.2* 12.0 - 15.0 g/dL Final   HCT  Date Value Ref Range Status  03/13/2015 30.2* 36.0 - 46.0 % Final    Physical Exam:  General: alert and cooperative Lochia: appropriate Uterine Fundus: firm Incision: n/a DVT Evaluation: No evidence of DVT seen on physical exam.  Discharge Diagnoses: Term Pregnancy-delivered  Discharge Information: Date: 03/14/2015 Activity: pelvic rest Diet: routine Medications: PNV, Ibuprofen and Percocet Condition: stable Instructions: refer to practice specific booklet Discharge to: home Follow-up Information    Schedule an appointment as soon as possible for a visit in 6 weeks to follow up.      Newborn Data: Live born female  Birth Weight: 8 lb 2.3 oz (3695 g) APGAR: 8, 9  Home with mother.  Amber Hendricks 03/14/2015, 8:35 AM

## 2015-04-21 NOTE — Lactation Note (Signed)
Lactation Consultation Note  Patient Name: Amber Hendricks ZOXWR'U Date: 04/21/2015   Patient called and left message in [facility] office for a return call.  ~76 week old infant would not eat yesterday during the day, but mom reports infant began eating last night very well.  Discussed growth spurt time frame and encouraged to continue cue based feedings.  Mom also reports S&S gas (pulling off breast and arching back with fussy crying), and reflux.  Mom reports some nipple pain on one side.  Also reported some recent "shoot pain from breast to nipple."  Denies any diaper rash or thrust.  LC encouraged mom to attend hospital support group on Monday night or Tuesday morning with baby present, so that LC in support group could further assess/discuss.  OP appointment option also discussed.  Questionable tongue or lip tie that may need to be checked since gas/reflux has been an ongoing issue since birth. Discussed preventing engorgement and mastitis.      Lendon Ka 04/21/2015, 2:02 PM

## 2015-05-07 ENCOUNTER — Telehealth (HOSPITAL_COMMUNITY): Payer: Self-pay | Admitting: Lactation Services

## 2015-05-07 NOTE — Telephone Encounter (Signed)
Mom called with concerns about baby crying at breast. Attempted to call Mom back, no answer. Left message to call again with concerns.

## 2015-05-10 ENCOUNTER — Ambulatory Visit (HOSPITAL_COMMUNITY): Admission: RE | Admit: 2015-05-10 | Payer: BLUE CROSS/BLUE SHIELD | Source: Ambulatory Visit

## 2015-05-10 NOTE — Telephone Encounter (Signed)
Mom called to cancel appointment but has some questions about the baby crying while breast feeding. States baby comes off the breast and cries then burps and goes back to the breast. Suggested baby may be gassy and that may be why she is crying. Reports she has used Mylicon drops and that helps baby but the is only using them once every other day. Suggested she may want to use them more often and see if that helps. Asking about reflux- reports baby is not spitting up much. Encouraged to talk to [facility] about that. Reports she had some bleeding from one nipple when she pumped yesterday but thinks suction was up too high on pump. No bleeding since. To see Ped on Tuesday. Encouragement given Suggested BFSG as resource for support. No further questions at present. To call prn

## 2015-05-15 ENCOUNTER — Ambulatory Visit (HOSPITAL_COMMUNITY): Payer: BLUE CROSS/BLUE SHIELD

## 2015-05-21 NOTE — Telephone Encounter (Signed)
Mom called today reporting baby fussy at breast all day yesterday and now today, not sustaining latch well, coming on/off the breast as if in pain. Not taking a bottle or pacifier well either. Mom has Peds appointment today at 11:30. Mom reports she has developed rash on left breast on the underside and top of breast, breast/nipple is itchy, nipple is pink. No burning with nursing or shininess to nipple.  Mom has APNC and used this last night and reports this helped with itching. Mom reports she has not changed any home products or used new products. She does report that she perspires more lately and dampness under breast. LC advised to continue [facility] as prescribed. If no improvement in symptoms by Thursday, call OB for f/u. Mom does not report observing any white patches in baby's mouth. If Peds does not treat baby and fussiness/symptoms continue, advised Mom she can try Gentian violet but this will need to be diluted by pharmacist to 0.25-0.5% solution to not irritate baby's mouth. Advised if she uses this to apply 1-2 times/day for 3-7 days per "Breastfeeding Management for the Clinician, Using the Evidence", 3rd ED, Geradine Girt. Also discussed use of Probiotics and symptoms continue.

## 2016-03-23 NOTE — L&D Delivery Note (Signed)
Delivery Note At 9:09 AM a viable female was delivered via Vaginal, Spontaneous Delivery (Presentation:OA ;  ).  APGAR: 9, 9; weight  .   Placenta status:spont >>intact , .  Cord:  with the following complications: .  Cord pH: not sent  Anesthesia:  epid + local Episiotomy: None Lacerations:  Sec deg Suture Repair: 3.0 vicryl rapide Est. Blood Loss (mL):   250 Mom to postpartum.  Baby to Couplet care / Skin to Skin.  Meriel PicaHOLLAND,[name] 08/06/2016, 9:36 AM

## 2016-03-31 DIAGNOSIS — Z363 Encounter for antenatal screening for malformations: Secondary | ICD-10-CM | POA: Diagnosis not present

## 2016-03-31 DIAGNOSIS — Z3A20 20 weeks gestation of pregnancy: Secondary | ICD-10-CM | POA: Diagnosis not present

## 2016-03-31 DIAGNOSIS — O09522 Supervision of elderly multigravida, second trimester: Secondary | ICD-10-CM | POA: Diagnosis not present

## 2016-04-30 DIAGNOSIS — Z3480 Encounter for supervision of other normal pregnancy, unspecified trimester: Secondary | ICD-10-CM | POA: Diagnosis not present

## 2016-04-30 DIAGNOSIS — Z3A24 24 weeks gestation of pregnancy: Secondary | ICD-10-CM | POA: Diagnosis not present

## 2016-04-30 DIAGNOSIS — Z23 Encounter for immunization: Secondary | ICD-10-CM | POA: Diagnosis not present

## 2016-07-14 DIAGNOSIS — Z3480 Encounter for supervision of other normal pregnancy, unspecified trimester: Secondary | ICD-10-CM | POA: Diagnosis not present

## 2016-08-05 ENCOUNTER — Encounter (HOSPITAL_COMMUNITY): Payer: Self-pay

## 2016-08-05 ENCOUNTER — Inpatient Hospital Stay (HOSPITAL_COMMUNITY)
Admission: AD | Admit: 2016-08-05 | Discharge: 2016-08-07 | DRG: 775 | Disposition: A | Discharge status: Home / Self Care | Payer: BLUE CROSS/BLUE SHIELD | Source: Ambulatory Visit | Attending: Obstetrics and Gynecology | Admitting: Obstetrics and Gynecology

## 2016-08-05 DIAGNOSIS — Z87891 Personal history of nicotine dependence: Secondary | ICD-10-CM

## 2016-08-05 DIAGNOSIS — Z3A38 38 weeks gestation of pregnancy: Secondary | ICD-10-CM

## 2016-08-05 DIAGNOSIS — G44229 Chronic tension-type headache, not intractable: Secondary | ICD-10-CM | POA: Diagnosis present

## 2016-08-05 LAB — OB RESULTS CONSOLE GBS: STREP GROUP B AG: NEGATIVE

## 2016-08-06 ENCOUNTER — Inpatient Hospital Stay (HOSPITAL_COMMUNITY): Payer: BLUE CROSS/BLUE SHIELD | Admitting: Anesthesiology

## 2016-08-06 ENCOUNTER — Encounter (HOSPITAL_COMMUNITY): Payer: Self-pay

## 2016-08-06 DIAGNOSIS — Z3493 Encounter for supervision of normal pregnancy, unspecified, third trimester: Secondary | ICD-10-CM | POA: Diagnosis not present

## 2016-08-06 DIAGNOSIS — Z87891 Personal history of nicotine dependence: Secondary | ICD-10-CM | POA: Diagnosis not present

## 2016-08-06 DIAGNOSIS — Z3A38 38 weeks gestation of pregnancy: Secondary | ICD-10-CM | POA: Diagnosis not present

## 2016-08-06 DIAGNOSIS — G44229 Chronic tension-type headache, not intractable: Secondary | ICD-10-CM | POA: Diagnosis not present

## 2016-08-06 LAB — CBC
HEMATOCRIT: 40.1 % (ref 36.0–46.0)
Hemoglobin: 13.8 g/dL (ref 12.0–15.0)
MCH: 32.2 pg (ref 26.0–34.0)
MCHC: 34.4 g/dL (ref 30.0–36.0)
MCV: 93.7 fL (ref 78.0–100.0)
PLATELETS: 200 10*3/uL (ref 150–400)
RBC: 4.28 MIL/uL (ref 3.87–5.11)
RDW: 13.7 % (ref 11.5–15.5)
WBC: 9.3 10*3/uL (ref 4.0–10.5)

## 2016-08-06 LAB — RPR: RPR Ser Ql: NONREACTIVE

## 2016-08-06 LAB — TYPE AND SCREEN
ABO/RH(D): O POS
ANTIBODY SCREEN: NEGATIVE

## 2016-08-06 MED ORDER — DIBUCAINE 1 % RE OINT: 1.0000 "application " | TOPICAL_OINTMENT | RECTAL | Status: DC | PRN

## 2016-08-06 MED ORDER — OXYCODONE-ACETAMINOPHEN 5-325 MG PO TABS: 2.0000 | ORAL_TABLET | ORAL | Status: DC | PRN

## 2016-08-06 MED ORDER — IBUPROFEN 800 MG PO TABS
800.0000 mg | ORAL_TABLET | Freq: Three times a day (TID) | ORAL | Status: DC | PRN
  Administered 2016-08-06 – 2016-08-07 (×3): 800 mg via ORAL
  Filled 2016-08-06 (×3): qty 1

## 2016-08-06 MED ORDER — ACETAMINOPHEN 325 MG PO TABS
650.0000 mg | ORAL_TABLET | ORAL | Status: DC | PRN
Start: 2016-08-06 — End: 2016-08-06

## 2016-08-06 MED ORDER — DIPHENHYDRAMINE HCL 25 MG PO CAPS: 25.0000 mg | ORAL_CAPSULE | Freq: Four times a day (QID) | ORAL | Status: DC | PRN

## 2016-08-06 MED ORDER — TETANUS-DIPHTH-ACELL PERTUSSIS 5-2.5-18.5 LF-MCG/0.5 IM SUSP: 0.5000 mL | Freq: Once | INTRAMUSCULAR | Status: DC

## 2016-08-06 MED ORDER — PHENYLEPHRINE 40 MCG/ML (10ML) SYRINGE FOR IV PUSH (FOR BLOOD PRESSURE SUPPORT)
80.0000 ug | PREFILLED_SYRINGE | INTRAVENOUS | Status: DC | PRN
  Filled 2016-08-06: qty 5

## 2016-08-06 MED ORDER — OXYTOCIN BOLUS FROM INFUSION
500.0000 mL | Freq: Once | INTRAVENOUS | Status: AC
  Administered 2016-08-06: 500 mL via INTRAVENOUS

## 2016-08-06 MED ORDER — ZOLPIDEM TARTRATE 5 MG PO TABS: 5.0000 mg | ORAL_TABLET | Freq: Every evening | ORAL | Status: DC | PRN

## 2016-08-06 MED ORDER — ACETAMINOPHEN 325 MG PO TABS
650.0000 mg | ORAL_TABLET | ORAL | Status: DC | PRN
  Administered 2016-08-06 – 2016-08-07 (×2): 650 mg via ORAL
  Filled 2016-08-06 (×3): qty 2

## 2016-08-06 MED ORDER — ONDANSETRON HCL 4 MG/2ML IJ SOLN
4.0000 mg | Freq: Four times a day (QID) | INTRAMUSCULAR | Status: DC | PRN
  Administered 2016-08-06: 4 mg via INTRAVENOUS
  Filled 2016-08-06: qty 2

## 2016-08-06 MED ORDER — MEASLES, MUMPS & RUBELLA VAC ~~LOC~~ INJ: 0.5000 mL | INJECTION | Freq: Once | SUBCUTANEOUS | Status: DC

## 2016-08-06 MED ORDER — DIPHENHYDRAMINE HCL 50 MG/ML IJ SOLN: 12.5000 mg | INTRAMUSCULAR | Status: DC | PRN

## 2016-08-06 MED ORDER — ONDANSETRON HCL 4 MG/2ML IJ SOLN: 4.0000 mg | INTRAMUSCULAR | Status: DC | PRN

## 2016-08-06 MED ORDER — BENZOCAINE-MENTHOL 20-0.5 % EX AERO
1.0000 "application " | INHALATION_SPRAY | CUTANEOUS | Status: DC | PRN
  Filled 2016-08-06: qty 56

## 2016-08-06 MED ORDER — LACTATED RINGERS IV SOLN: 500.0000 mL | Freq: Once | INTRAVENOUS | Status: DC

## 2016-08-06 MED ORDER — WITCH HAZEL-GLYCERIN EX PADS: 1.0000 "application " | MEDICATED_PAD | CUTANEOUS | Status: DC | PRN

## 2016-08-06 MED ORDER — LIDOCAINE HCL (PF) 1 % IJ SOLN
30.0000 mL | INTRAMUSCULAR | Status: DC | PRN
  Administered 2016-08-06: 30 mL via SUBCUTANEOUS
  Filled 2016-08-06: qty 30

## 2016-08-06 MED ORDER — ONDANSETRON HCL 4 MG PO TABS: 4.0000 mg | ORAL_TABLET | ORAL | Status: DC | PRN

## 2016-08-06 MED ORDER — EPHEDRINE 5 MG/ML INJ
10.0000 mg | INTRAVENOUS | Status: DC | PRN
  Filled 2016-08-06: qty 2

## 2016-08-06 MED ORDER — OXYCODONE-ACETAMINOPHEN 5-325 MG PO TABS: 1.0000 | ORAL_TABLET | ORAL | Status: DC | PRN

## 2016-08-06 MED ORDER — PHENYLEPHRINE 40 MCG/ML (10ML) SYRINGE FOR IV PUSH (FOR BLOOD PRESSURE SUPPORT)
80.0000 ug | PREFILLED_SYRINGE | INTRAVENOUS | Status: DC | PRN
  Filled 2016-08-06: qty 5
  Filled 2016-08-06: qty 10

## 2016-08-06 MED ORDER — FENTANYL 2.5 MCG/ML BUPIVACAINE 1/10 % EPIDURAL INFUSION (WH - ANES)
14.0000 mL/h | INTRAMUSCULAR | Status: DC | PRN
  Administered 2016-08-06: 14 mL/h via EPIDURAL
  Filled 2016-08-06: qty 100

## 2016-08-06 MED ORDER — COCONUT OIL OIL
1.0000 "application " | TOPICAL_OIL | Status: DC | PRN
  Filled 2016-08-06: qty 120

## 2016-08-06 MED ORDER — SIMETHICONE 80 MG PO CHEW: 80.0000 mg | CHEWABLE_TABLET | ORAL | Status: DC | PRN

## 2016-08-06 MED ORDER — FLEET ENEMA 7-19 GM/118ML RE ENEM: 1.0000 | ENEMA | RECTAL | Status: DC | PRN

## 2016-08-06 MED ORDER — BISACODYL 10 MG RE SUPP: 10.0000 mg | Freq: Every day | RECTAL | Status: DC | PRN

## 2016-08-06 MED ORDER — LACTATED RINGERS IV SOLN: 500.0000 mL | INTRAVENOUS | Status: DC | PRN

## 2016-08-06 MED ORDER — LIDOCAINE HCL (PF) 1 % IJ SOLN
INTRAMUSCULAR | Status: DC | PRN
  Administered 2016-08-06 (×2): 4 mL via EPIDURAL

## 2016-08-06 MED ORDER — PRENATAL MULTIVITAMIN CH
1.0000 | ORAL_TABLET | Freq: Every day | ORAL | Status: DC
  Administered 2016-08-06 – 2016-08-07 (×2): 1 via ORAL
  Filled 2016-08-06 (×2): qty 1

## 2016-08-06 MED ORDER — HYDROMORPHONE HCL 1 MG/ML IJ SOLN: 1.0000 mg | INTRAMUSCULAR | Status: DC | PRN

## 2016-08-06 MED ORDER — FLEET ENEMA 7-19 GM/118ML RE ENEM: 1.0000 | ENEMA | Freq: Every day | RECTAL | Status: DC | PRN

## 2016-08-06 MED ORDER — OXYTOCIN 40 UNITS IN LACTATED RINGERS INFUSION - SIMPLE MED
2.5000 [IU]/h | INTRAVENOUS | Status: DC
  Filled 2016-08-06: qty 1000

## 2016-08-06 MED ORDER — SENNOSIDES-DOCUSATE SODIUM 8.6-50 MG PO TABS
2.0000 | ORAL_TABLET | ORAL | Status: DC
  Administered 2016-08-07: 2 via ORAL
  Filled 2016-08-06: qty 2

## 2016-08-06 MED ORDER — SOD CITRATE-CITRIC ACID 500-334 MG/5ML PO SOLN
30.0000 mL | ORAL | Status: DC | PRN
  Administered 2016-08-06: 30 mL via ORAL
  Filled 2016-08-06: qty 15

## 2016-08-06 MED ORDER — LACTATED RINGERS IV SOLN
INTRAVENOUS | Status: DC
  Administered 2016-08-06: 01:00:00 via INTRAVENOUS

## 2016-08-06 NOTE — Anesthesia Preprocedure Evaluation (Signed)
Anesthesia Evaluation  Patient identified by MRN, date of birth, ID band Patient awake    Reviewed: Allergy & Precautions, Patient's Chart, lab work & pertinent test results  Airway Mallampati: II  TM Distance: >3 FB Neck ROM: Full    Dental no notable dental hx. (+) Teeth Intact   Pulmonary former smoker,    Pulmonary exam normal breath sounds clear to auscultation       Cardiovascular negative cardio ROS Normal cardiovascular exam Rhythm:Regular Rate:Normal     Neuro/Psych  Headaches,    GI/Hepatic Neg liver ROS, GERD  Medicated and Controlled,  Endo/Other  negative endocrine ROS  Renal/GU negative Renal ROS  negative genitourinary   Musculoskeletal negative musculoskeletal ROS (+)   Abdominal   Peds  Hematology negative hematology ROS (+)   Anesthesia Other Findings   Reproductive/Obstetrics (+) Pregnancy                             Anesthesia Physical Anesthesia Plan  ASA: II  Anesthesia Plan: Epidural   Post-op Pain Management:    Induction:   Airway Management Planned: Natural Airway  Additional Equipment:   Intra-op Plan:   Post-operative Plan:   Informed Consent: I have reviewed the patients History and Physical, chart, labs and discussed the procedure including the risks, benefits and alternatives for the proposed anesthesia with the patient or authorized representative who has indicated his/her understanding and acceptance.     Plan Discussed with: Anesthesiologist  Anesthesia Plan Comments:         Anesthesia Quick Evaluation

## 2016-08-06 NOTE — Lactation Note (Signed)
This note was copied from a baby's chart. Lactation Consultation Note  Patient Name: Amber Hendricks Date: 08/06/2016 Reason for consult: Initial assessment Assisted Mom with positioning and obtaining good depth with latch. Mom reports nipple tenderness, some dimpling noted at the beginning of the feeding. Demonstrated to Mom how to un-tuck lips, discomfort improved with baby nursing. Mom reports history of LMS with 1st baby who is 7416 months old. Denies medical history that would affect milk supply. Also denies breast changes 1st trimester this pregnancy. Mom interested in pumping to support milk production. Mom has Medela DEBP at home. Mom very tired at this visit and baby nursing frequently already so suggested to Mom to BF on demand with baby, may consider pumping starting tomorrow. Discussed supplements for Mom to consider to support milk production and protect milk supply. Advised BF frequently and STS will help. Encouraged hand expression before/after feedings. Lactation brochure left for review, advised of OP services and support group. Advised to apply EBM to sore nipples along with coconut oil. Continue to BF with feeding ques, 8-12 times or more in 24 hours. Call for assist as needed with latch or for questions/concerns.   Maternal Data Has patient been taught Hand Expression?: Yes Does the patient have breastfeeding experience prior to this delivery?: Yes  Feeding Feeding Type: Breast Fed Length of feed: 20 min  LATCH Score/Interventions Latch: Grasps breast easily, tongue down, lips flanged, rhythmical sucking. Intervention(s): Adjust position;Assist with latch;Breast massage;Breast compression  Audible Swallowing: A few with stimulation  Type of Nipple: Everted at rest and after stimulation  Comfort (Breast/Nipple): Filling, red/small blisters or bruises, mild/mod discomfort  Problem noted: Mild/Moderate discomfort (redness at tip of nipple bilateral) Interventions  (Mild/moderate discomfort): Hand expression;Hand massage (EBM to sore nipples, coconut oil prn)  Hold (Positioning): Assistance needed to correctly position infant at breast and maintain latch. Intervention(s): Breastfeeding basics reviewed;Support Pillows;Position options;Skin to skin  LATCH Score: 7  Lactation Tools Discussed/Used WIC Program: No   Consult Status Consult Status: Follow-up Date: 08/07/16 Follow-up type: In-patient    Alfred LevinsGranger, Fabio Wah Ann 08/06/2016, 12:19 PM

## 2016-08-06 NOTE — MAU Note (Signed)
Pt reports contractions that started this afternoon and are every 5 mins apart. Pt denies LOF. Reports a brownish discharge. States she was 2.5cm on Monday. Reports good fetal movement.

## 2016-08-06 NOTE — Progress Notes (Signed)
MOB was referred for history of depression/anxiety. * Referral screened out by Clinical Social Worker because none of the following criteria appear to apply: ~ History of anxiety/depression during this pregnancy, or of post-partum depression. ~ Diagnosis of anxiety and/or depression within last 3 years OR * MOB's symptoms currently being treated with medication and/or therapy. Please contact the Clinical Social Worker if needs arise, or if MOB requests.   

## 2016-08-06 NOTE — Anesthesia Postprocedure Evaluation (Signed)
Anesthesia Post Note  Patient: Amber Hendricks  Procedure(s) Performed: * No procedures listed *  Patient location during evaluation: Mother Baby Anesthesia Type: Epidural Level of consciousness: awake Pain management: pain level controlled Vital Signs Assessment: post-procedure vital signs reviewed and stable Respiratory status: spontaneous breathing Cardiovascular status: stable Postop Assessment: no headache, no backache, epidural receding, patient able to bend at knees, no signs of nausea or vomiting and adequate PO intake Anesthetic complications: no        Last Vitals:  Vitals:   08/06/16 1132 08/06/16 1227  BP: (!) 103/54 (!) 101/56  Pulse: 61 (!) 57  Resp: 18 18  Temp: 36.9 C 37 C    Last Pain:  Vitals:   08/06/16 1227  TempSrc: Oral  PainSc: 0-No pain   Pain Goal:                 Amber Hendricks

## 2016-08-06 NOTE — Anesthesia Pain Management Evaluation Note (Signed)
  CRNA Pain Management Visit Note  Patient: Amber Hendricks, 41 y.o., female  "Hello I am a member of the anesthesia team at [facility]. We have an anesthesia team available at all times to provide care throughout the hospital, including epidural management and anesthesia for C-section. I don't know your plan for the delivery whether it a natural birth, water birth, IV sedation, nitrous supplementation, doula or epidural, but we want to meet your pain goals."   1.Was your pain managed to your expectations on prior hospitalizations?   Yes   2.What is your expectation for pain management during this hospitalization?     Epidural  3.How can we help you reach that goal? epidural  Record the patient's initial score and the patient's pain goal.   Pain: 0  Pain Goal: 4 [facility] wants you to be able to say your pain was always managed very well.  Giavanni Odonovan 08/06/2016

## 2016-08-06 NOTE — H&P (Signed)
Amber Hendricks is a 41 y.o. female presenting for labor. OB History    Gravida Para Term Preterm AB Living   4 1 1   2 1    SAB TAB Ectopic Multiple Live Births   2     0 1     Past Medical History:  Diagnosis Date  . Anxiety   . Chronic tension headaches    /cervicogenic HAs- Dr Terrace ArabiaYan consults 3/15  . Cysts of both ovaries   . Former smoker   . History of migraine headaches    Menstual- no aura   Past Surgical History:  Procedure Laterality Date  . WISDOM TOOTH EXTRACTION     Family History: family history includes Colon cancer in her maternal uncle; Dementia in her paternal grandfather and paternal grandmother; Diabetes in her maternal grandfather; Heart disease in her maternal grandfather and paternal grandfather; Osteoarthritis in her father; Osteoporosis in her mother. Social History:  reports that she quit smoking about 6 years ago. She has never used smokeless tobacco. She reports that she does not drink alcohol or use drugs.     Maternal Diabetes: No Genetic Screening: Normal Maternal Ultrasounds/Referrals: Normal Fetal Ultrasounds or other Referrals:  None Maternal Substance Abuse:  No Significant Maternal Medications:  None Significant Maternal Lab Results:  None Other Comments:  None  ROS History Dilation: 7 Effacement (%): 70, 80 Station: 0 Exam by:: Amber DaftShay Payne RN Blood pressure (!) 95/57, pulse 64, temperature 98.4 F (36.9 C), temperature source Oral, resp. rate 18, height 5' 5.5" (1.664 m), weight 72.1 kg (159 lb), SpO2 100 %, unknown if currently breastfeeding. Exam Physical Exam  Prenatal labs: ABO, Rh: --/--/O POS (05/17 0045) Antibody: NEG (05/17 0045) Rubella:   RPR:    HBsAg:    HIV:    GBS: Negative (05/16 0000)   Assessment/Plan: 41yo multip presenting in labor. Now 7cm, s/p AROM for thin mec. Expectant management.  Pregnancy uncomplicated. GBS neg.     Amber Hendricks 08/06/2016, 5:15 AM

## 2016-08-06 NOTE — Anesthesia Procedure Notes (Signed)
Epidural Patient location during procedure: OB Start time: 08/06/2016 1:31 AM  Staffing Anesthesiologist: Mal AmabileFOSTER, Tiny Rietz Performed: anesthesiologist   Preanesthetic Checklist Completed: patient identified, site marked, surgical consent, pre-op evaluation, timeout performed, IV checked, risks and benefits discussed and monitors and equipment checked  Epidural Patient position: sitting Prep: site prepped and draped and DuraPrep Patient monitoring: continuous pulse ox and blood pressure Approach: midline Location: L4-L5 Injection technique: LOR air  Needle:  Needle type: Tuohy  Needle gauge: 17 G Needle length: 9 cm and 9 Needle insertion depth: 4 cm Catheter type: closed end flexible Catheter size: 19 Gauge Catheter at skin depth: 9 cm Test dose: negative and Other  Assessment Events: blood not aspirated, injection not painful, no injection resistance, negative IV test and no paresthesia  Additional Notes Patient identified. Risks and benefits discussed including failed block, incomplete  Pain control, post dural puncture headache, nerve damage, paralysis, blood pressure Changes, nausea, vomiting, reactions to medications-both toxic and allergic and post Partum back pain. All questions were answered. Patient expressed understanding and wished to proceed. Sterile technique was used throughout procedure. Epidural site was Dressed with sterile barrier dressing. No paresthesias, signs of intravascular injection Or signs of intrathecal spread were encountered.  Patient was more comfortable after the epidural was dosed. Please see RN's note for documentation of vital signs and FHR which are stable.

## 2016-08-07 LAB — CBC
HEMATOCRIT: 34.8 % — AB (ref 36.0–46.0)
Hemoglobin: 11.9 g/dL — ABNORMAL LOW (ref 12.0–15.0)
MCH: 32.6 pg (ref 26.0–34.0)
MCHC: 34.2 g/dL (ref 30.0–36.0)
MCV: 95.3 fL (ref 78.0–100.0)
PLATELETS: 157 10*3/uL (ref 150–400)
RBC: 3.65 MIL/uL — AB (ref 3.87–5.11)
RDW: 13.9 % (ref 11.5–15.5)
WBC: 9.8 10*3/uL (ref 4.0–10.5)

## 2016-08-07 MED ORDER — IBUPROFEN 800 MG PO TABS: 800.0000 mg | ORAL_TABLET | Freq: Three times a day (TID) | ORAL | 0 refills | Status: DC | PRN

## 2016-08-07 NOTE — Discharge Summary (Signed)
Obstetric Discharge Summary Reason for Admission: onset of labor Prenatal Procedures: none Intrapartum Procedures: spontaneous vaginal delivery Postpartum Procedures: none Complications-Operative and Postpartum: 2nd degree perineal laceration Hemoglobin  Date Value Ref Range Status  08/07/2016 11.9 (L) 12.0 - 15.0 g/dL Final   HCT  Date Value Ref Range Status  08/07/2016 34.8 (L) 36.0 - 46.0 % Final    Physical Exam:  General: 2nd degree perineal laceration Lochia:appropriate  Uterine Fundus: firm Incision: healing well DVT Evaluation: No evidence of DVT seen on physical exam.  Discharge Diagnoses: Term Pregnancy-delivered  Discharge Information: Date: 08/07/2016 Activity: pelvic rest Diet: routine Medications: Ibuprofen Condition: stable Instructions: refer to practice specific booklet Discharge to: home   Newborn Data: Live born female  Birth Weight: 8 lb 4 oz (3742 g) APGAR: 9, 9  Home with mother.  Eugean Arnott L 08/07/2016, 9:54 AM

## 2016-08-07 NOTE — Lactation Note (Signed)
This note was copied from a baby's chart. Lactation Consultation Note  Patient Name: Amber Hendricks ZOXWR'UToday's Date: 08/07/2016 Reason for consult: Follow-up assessment Baby circumcised this morning and asleep. Baby cluster fed during the night. Mom reports some mild nipple tenderness, she is applying EBM/coconut oil. No breakdown observed. Advised to continue to BF with feeding ques, 8-12 times or more in 24 hours. Engorgement care reviewed if needed. Mom had questions about pumping and supplementing in few weeks. Reviewed breast milk storage guidelines. Advised of OP services and support group. Encouraged Mom to call if she would like LC assist before d/c today.   Maternal Data    Feeding Feeding Type: Breast Fed Length of feed: 12 min  LATCH Score/Interventions                      Lactation Tools Discussed/Used     Consult Status Consult Status: Complete Date: 08/07/16 Follow-up type: In-patient    Alfred LevinsGranger, Enos Muhl Ann 08/07/2016, 10:58 AM

## 2016-09-16 DIAGNOSIS — Z1389 Encounter for screening for other disorder: Secondary | ICD-10-CM | POA: Diagnosis not present

## 2016-09-16 DIAGNOSIS — N390 Urinary tract infection, site not specified: Secondary | ICD-10-CM | POA: Diagnosis not present

## 2016-09-16 LAB — HM PAP SMEAR

## 2017-03-24 DIAGNOSIS — Z3202 Encounter for pregnancy test, result negative: Secondary | ICD-10-CM | POA: Diagnosis not present

## 2017-03-24 DIAGNOSIS — Z1231 Encounter for screening mammogram for malignant neoplasm of breast: Secondary | ICD-10-CM | POA: Diagnosis not present

## 2017-03-24 LAB — HM MAMMOGRAPHY

## 2017-09-22 DIAGNOSIS — Z01419 Encounter for gynecological examination (general) (routine) without abnormal findings: Secondary | ICD-10-CM | POA: Diagnosis not present

## 2017-09-22 DIAGNOSIS — Z6821 Body mass index (BMI) 21.0-21.9, adult: Secondary | ICD-10-CM | POA: Diagnosis not present

## 2017-09-22 DIAGNOSIS — Z1212 Encounter for screening for malignant neoplasm of rectum: Secondary | ICD-10-CM | POA: Diagnosis not present

## 2017-09-22 LAB — HM PAP SMEAR

## 2018-01-19 ENCOUNTER — Ambulatory Visit: Payer: BLUE CROSS/BLUE SHIELD | Admitting: Family Medicine

## 2018-01-19 ENCOUNTER — Encounter: Payer: Self-pay | Admitting: Family Medicine

## 2018-01-19 VITALS — BP 116/64 | HR 57 | Temp 98.4°F | Ht 65.5 in | Wt 126.6 lb

## 2018-01-19 DIAGNOSIS — Z0000 Encounter for general adult medical examination without abnormal findings: Secondary | ICD-10-CM | POA: Diagnosis not present

## 2018-01-19 DIAGNOSIS — J069 Acute upper respiratory infection, unspecified: Secondary | ICD-10-CM | POA: Diagnosis not present

## 2018-01-19 DIAGNOSIS — J029 Acute pharyngitis, unspecified: Secondary | ICD-10-CM | POA: Diagnosis not present

## 2018-01-19 LAB — CBC WITH DIFFERENTIAL/PLATELET
BASOS ABS: 0 10*3/uL (ref 0.0–0.1)
Basophils Relative: 0.4 % (ref 0.0–3.0)
Eosinophils Absolute: 0.1 10*3/uL (ref 0.0–0.7)
Eosinophils Relative: 0.8 % (ref 0.0–5.0)
HCT: 39.2 % (ref 36.0–46.0)
Hemoglobin: 13.5 g/dL (ref 12.0–15.0)
LYMPHS ABS: 1.3 10*3/uL (ref 0.7–4.0)
Lymphocytes Relative: 18.1 % (ref 12.0–46.0)
MCHC: 34.6 g/dL (ref 30.0–36.0)
MCV: 91.8 fl (ref 78.0–100.0)
MONO ABS: 0.3 10*3/uL (ref 0.1–1.0)
Monocytes Relative: 4.6 % (ref 3.0–12.0)
NEUTROS PCT: 76.1 % (ref 43.0–77.0)
Neutro Abs: 5.6 10*3/uL (ref 1.4–7.7)
PLATELETS: 305 10*3/uL (ref 150.0–400.0)
RBC: 4.27 Mil/uL (ref 3.87–5.11)
RDW: 13 % (ref 11.5–15.5)
WBC: 7.3 10*3/uL (ref 4.0–10.5)

## 2018-01-19 LAB — TSH: TSH: 1.02 u[IU]/mL (ref 0.35–4.50)

## 2018-01-19 LAB — VITAMIN D 25 HYDROXY (VIT D DEFICIENCY, FRACTURES): VITD: 41.57 ng/mL (ref 30.00–100.00)

## 2018-01-19 LAB — COMPREHENSIVE METABOLIC PANEL
ALK PHOS: 90 U/L (ref 39–117)
ALT: 46 U/L — AB (ref 0–35)
AST: 20 U/L (ref 0–37)
Albumin: 4.5 g/dL (ref 3.5–5.2)
BILIRUBIN TOTAL: 0.4 mg/dL (ref 0.2–1.2)
BUN: 9 mg/dL (ref 6–23)
CO2: 26 mEq/L (ref 19–32)
Calcium: 9.4 mg/dL (ref 8.4–10.5)
Chloride: 106 mEq/L (ref 96–112)
Creatinine, Ser: 0.73 mg/dL (ref 0.40–1.20)
GFR: 93.7 mL/min (ref >60.00)
GLUCOSE: 109 mg/dL — AB (ref 70–99)
Potassium: 4.4 mEq/L (ref 3.5–5.1)
SODIUM: 140 meq/L (ref 135–145)
TOTAL PROTEIN: 7 g/dL (ref 6.0–8.3)

## 2018-01-19 LAB — LIPID PANEL
Cholesterol: 204 mg/dL — ABNORMAL HIGH (ref 0–200)
HDL: 60.5 mg/dL (ref >39.00)
LDL Cholesterol: 111 mg/dL — ABNORMAL HIGH (ref 0–99)
NonHDL: 143.71
Total CHOL/HDL Ratio: 3
Triglycerides: 166 mg/dL — ABNORMAL HIGH (ref 0.0–149.0)
VLDL: 33.2 mg/dL (ref 0.0–40.0)

## 2018-01-19 LAB — POCT RAPID STREP A (OFFICE): RAPID STREP A SCREEN: NEGATIVE

## 2018-01-19 MED ORDER — AZITHROMYCIN 250 MG PO TABS: ORAL_TABLET | ORAL | 0 refills | Status: DC

## 2018-01-19 MED ORDER — BENZONATATE 200 MG PO CAPS: 200.0000 mg | ORAL_CAPSULE | Freq: Three times a day (TID) | ORAL | 0 refills | Status: DC | PRN

## 2018-01-19 MED ORDER — FLUTICASONE PROPIONATE 50 MCG/ACT NA SUSP: 2.0000 | Freq: Every day | NASAL | 6 refills | Status: DC

## 2018-01-19 NOTE — Patient Instructions (Signed)
Laryngitis Laryngitis is swelling (inflammation) of your vocal cords. This causes hoarseness, coughing, loss of voice, sore throat, or a dry throat. When your vocal cords are inflamed, your voice sounds different. Laryngitis can be temporary (acute) or long-term (chronic). Most cases of acute laryngitis improve with time. Chronic laryngitis is laryngitis that lasts for more than three weeks. Follow these instructions at home:  Drink enough fluid to keep your pee (urine) clear or pale yellow.  Breathe in moist air. Use a humidifier if you live in a dry climate.  Take medicines only as told by your doctor.  Do not smoke cigarettes or electronic cigarettes. If you need help quitting, ask your doctor.  Talk as little as possible. Also avoid whispering, which can cause vocal strain.  Write instead of talking. Do this until your voice is back to normal. Contact a doctor if:  You have a fever.  Your pain is worse.  You have trouble swallowing. Get help right away if:  You cough up blood.  You have trouble breathing. This information is not intended to replace advice given to you by your health care provider. Make sure you discuss any questions you have with your health care provider. Document Released: 02/26/2011 Document Revised: 08/15/2015 Document Reviewed: 08/22/2013 Elsevier Interactive Patient Education  2018 Elsevier Inc. Upper Respiratory Infection, Adult Most upper respiratory infections (URIs) are caused by a virus. A URI affects the nose, throat, and upper air passages. The most common type of URI is often called "the common cold." Follow these instructions at home:  Take medicines only as told by your doctor.  Gargle warm saltwater or take cough drops to comfort your throat as told by your doctor.  Use a warm mist humidifier or inhale steam from a shower to increase air moisture. This may make it easier to breathe.  Drink enough fluid to keep your pee (urine) clear or  pale yellow.  Eat soups and other clear broths.  Have a healthy diet.  Rest as needed.  Go back to work when your fever is gone or your doctor says it is okay. ? You may need to stay home longer to avoid giving your URI to others. ? You can also wear a face mask and wash your hands often to prevent spread of the virus.  Use your inhaler more if you have asthma.  Do not use any tobacco products, including cigarettes, chewing tobacco, or electronic cigarettes. If you need help quitting, ask your doctor. Contact a doctor if:  You are getting worse, not better.  Your symptoms are not helped by medicine.  You have chills.  You are getting more short of breath.  You have brown or red mucus.  You have yellow or brown discharge from your nose.  You have pain in your face, especially when you bend forward.  You have a fever.  You have puffy (swollen) neck glands.  You have pain while swallowing.  You have white areas in the back of your throat. Get help right away if:  You have very bad or constant: ? Headache. ? Ear pain. ? Pain in your forehead, behind your eyes, and over your cheekbones (sinus pain). ? Chest pain.  You have long-lasting (chronic) lung disease and any of the following: ? Wheezing. ? Long-lasting cough. ? Coughing up blood. ? A change in your usual mucus.  You have a stiff neck.  You have changes in your: ? Vision. ? Hearing. ? Thinking. ? Mood. This information  is not intended to replace advice given to you by your health care provider. Make sure you discuss any questions you have with your health care provider. Document Released: 08/26/2007 Document Revised: 11/10/2015 Document Reviewed: 06/14/2013 Elsevier Interactive Patient Education  2018 Elsevier Inc.  

## 2018-01-19 NOTE — Progress Notes (Signed)
Patient: Amber Hendricks MRN: [id number] DOB: 07/25/1975 PCP: Orma Flaming, MD     Subjective:  Chief Complaint  Patient presents with  . Establish Care    HPI: The patient is a 42 y.o. female who presents today for annual exam. She denies any changes to past medical history. There have been no recent hospitalizations. They are following a well balanced diet and exercise plan. Weight has been stable. Complaints include URI symptoms.    URI symptoms: started about one week ago with sore throat and some sinus pressure. She states it has all been in her throat region. She states her throat is no longer sore, but has a tickle sensation and recurrent cough that she can not stop and is uncontrollable. Not really congested, but mild runny nose. no fever/chills. NO sick contacts and her kids have been healthy.  She has no asthma, no smoking and no COPD. She has had her flu shot. She has been using claritin D, claritin, robitussin, mucines, sinex, thera flu. She has not used any flonase. Nothing is really doing much.    Immunization History  Administered Date(s) Administered  . MMR 03/14/2015    Mammogram: has had one and normal. Has repeat coming up.  Pap smear: 09/21/2017-normal  Tdap: 05/12/2016 Colonoscopy: 42 years of age Flu shot: done  Review of Systems  Constitutional: Positive for fatigue. Negative for chills and fever.  HENT: Positive for congestion, ear pain and sore throat. Negative for dental problem, hearing loss, sinus pressure, sinus pain and trouble swallowing.   Eyes: Negative for visual disturbance.  Respiratory: Positive for cough and wheezing. Negative for chest tightness and shortness of breath.   Cardiovascular: Negative for chest pain, palpitations and leg swelling.  Gastrointestinal: Negative for abdominal pain, blood in stool, diarrhea and nausea.  Endocrine: Negative for cold intolerance, polydipsia, polyphagia and polyuria.  Genitourinary: Negative for dysuria and  hematuria.  Musculoskeletal: Negative for arthralgias and back pain.  Skin: Negative.  Negative for rash.  Neurological: Positive for headaches. Negative for dizziness.  Psychiatric/Behavioral: Positive for sleep disturbance. Negative for dysphoric mood. The patient is not nervous/anxious.     Allergies Patient has No Known Allergies.  Past Medical History Patient  has a past medical history of Allergy, Anxiety, Chronic tension headaches, Cysts of both ovaries, Former smoker, Frequent headaches, History of chicken pox, History of migraine headaches, Hyperlipidemia, Migraines, and UTI (urinary tract infection).  Surgical History Patient  has a past surgical history that includes Wisdom tooth extraction.  Family History Pateint's family history includes Colon cancer in her maternal uncle; Dementia in her paternal grandfather and paternal grandmother; Diabetes in her maternal grandfather; Early death in her paternal grandfather; Hearing loss in her maternal grandmother and paternal grandmother; Heart attack in her father and paternal grandfather; Heart disease in her maternal grandfather and paternal grandfather; Hyperlipidemia in her mother; Hypertension in her father and maternal grandmother; Osteoarthritis in her father; Osteoporosis in her mother.  Social History Patient  reports that she quit smoking about 8 years ago. Her smoking use included cigarettes. She has never used smokeless tobacco. She reports that she does not drink alcohol or use drugs.    Objective: Vitals:   01/19/18 1026  BP: 116/64  Pulse: (!) 57  Temp: 98.4 F (36.9 C)  TempSrc: Oral  SpO2: 100%  Weight: 126 lb 9.6 oz (57.4 kg)  Height: 5' 5.5" (1.664 m)    Body mass index is 20.75 kg/m.  Physical Exam  Constitutional: She is oriented  to person, place, and time. She appears well-developed and well-nourished.  HENT:  Right Ear: External ear normal.  Left Ear: External ear normal.  Mouth/Throat: Oropharynx  is clear and moist.  Tm pearly with light reflex bilaterally  Mild TTP over maxillary sinuses  Right tonsillar exudate and cobblestoning on posterior pharynx   Eyes: Pupils are equal, round, and reactive to light. Conjunctivae and EOM are normal.  Neck: Normal range of motion. Neck supple. No thyromegaly present.  Cardiovascular: Normal rate, regular rhythm, normal heart sounds and intact distal pulses.  No murmur heard. Pulmonary/Chest: Effort normal and breath sounds normal.  Abdominal: Soft. Bowel sounds are normal. She exhibits no distension. There is no tenderness.  Lymphadenopathy:    She has cervical adenopathy.  Neurological: She is alert and oriented to person, place, and time. She displays normal reflexes. No cranial nerve deficit. Coordination normal.  Skin: Skin is warm and dry. No rash noted.  Psychiatric: She has a normal mood and affect. Her behavior is normal.  Vitals reviewed.  Strep: negative     Assessment/plan: 1. Annual physical exam UTD on her health maintenance. Routine lab work today. Continue healthy lifestyle and f/lu in one year or as needed.  - Comprehensive metabolic panel - CBC with Differential/Platelet - Lipid panel - TSH - VITAMIN D 25 Hydroxy (Vit-D Deficiency, Fractures)  2. Sore throat Likely from post nasal drip. flonase given and instructed on proper use.  - POCT rapid strep A  3. Acute URI Conservative therapy at this point with cool mist humidifier (to help laryngitis as well), flonase, honey and tessalon pearls prn. Will send in zpack to start if fever/worsening symptoms or prolonged course of symptoms >10 days.    Return in about 1 year (around 01/20/2019).     Orma Flaming, MD [place]  01/19/2018

## 2018-01-20 ENCOUNTER — Other Ambulatory Visit: Payer: Self-pay | Admitting: Family Medicine

## 2018-01-20 DIAGNOSIS — R748 Abnormal levels of other serum enzymes: Secondary | ICD-10-CM

## 2018-02-21 ENCOUNTER — Telehealth: Payer: Self-pay | Admitting: Family Medicine

## 2018-02-21 ENCOUNTER — Other Ambulatory Visit (INDEPENDENT_AMBULATORY_CARE_PROVIDER_SITE_OTHER): Payer: BLUE CROSS/BLUE SHIELD

## 2018-02-21 DIAGNOSIS — R748 Abnormal levels of other serum enzymes: Secondary | ICD-10-CM | POA: Diagnosis not present

## 2018-02-21 LAB — COMPREHENSIVE METABOLIC PANEL
ALT: 16 U/L (ref 0–35)
AST: 11 U/L (ref 0–37)
Albumin: 4.3 g/dL (ref 3.5–5.2)
Alkaline Phosphatase: 49 U/L (ref 39–117)
BILIRUBIN TOTAL: 0.5 mg/dL (ref 0.2–1.2)
BUN: 14 mg/dL (ref 6–23)
CHLORIDE: 104 meq/L (ref 96–112)
CO2: 27 mEq/L (ref 19–32)
CREATININE: 0.85 mg/dL (ref 0.40–1.20)
Calcium: 9.2 mg/dL (ref 8.4–10.5)
GFR: 78.57 mL/min (ref >60.00)
GLUCOSE: 125 mg/dL — AB (ref 70–99)
Potassium: 4.2 mEq/L (ref 3.5–5.1)
SODIUM: 139 meq/L (ref 135–145)
Total Protein: 6.8 g/dL (ref 6.0–8.3)

## 2018-02-21 NOTE — Telephone Encounter (Signed)
See result note.  

## 2018-02-21 NOTE — Telephone Encounter (Signed)
Patient calling to obtain lab results. Nurse triage currently unavailable. Please advise.   Copied from CRM [id number]. Topic: Quick Communication - Lab Results (Clinic Use ONLY) >> Feb 21, 2018  4:33 PM Zellmer, Alessandra BevelsJennifer M, CMA wrote: Called patient to inform them of 12/2 lab results. When patient returns call, triage nurse may disclose results.

## 2018-02-25 NOTE — Telephone Encounter (Signed)
Pt calling back to obtain lab results. Please advise.

## 2018-03-09 ENCOUNTER — Other Ambulatory Visit (INDEPENDENT_AMBULATORY_CARE_PROVIDER_SITE_OTHER): Payer: BLUE CROSS/BLUE SHIELD

## 2018-03-09 ENCOUNTER — Telehealth: Payer: Self-pay | Admitting: Radiology

## 2018-03-09 ENCOUNTER — Other Ambulatory Visit: Payer: Self-pay | Admitting: Family Medicine

## 2018-03-09 DIAGNOSIS — R7309 Other abnormal glucose: Secondary | ICD-10-CM

## 2018-03-09 LAB — HEMOGLOBIN A1C: HEMOGLOBIN A1C: 5.4 % (ref 4.6–6.5)

## 2018-03-09 NOTE — Telephone Encounter (Signed)
Please place future lab orders for Pts lab appt this AM. Thanks!

## 2018-03-09 NOTE — Telephone Encounter (Signed)
Lab orders placed.  

## 2018-03-10 ENCOUNTER — Ambulatory Visit: Payer: Self-pay | Admitting: *Deleted

## 2018-03-10 NOTE — Telephone Encounter (Signed)
See note

## 2018-03-10 NOTE — Telephone Encounter (Signed)
Pt returned call to receive lab results and also voiced complaints of needing a Z-pak. LOV on 01/19/18 pt was given a Zpak for acute URI and pt states she has not fully recovered. Pt states she is having congestion in her chest and when she blows her nose she is having green drainage. Pt has not voiced having fevers at this time. Pt states she has been using over the counter medication with no improvement. Pt asking if an additional Zpak could be sent to [facility] on [address]

## 2018-03-11 NOTE — Telephone Encounter (Signed)
Let her know I don't call in antibiotics as we dont' like to over prescribe if viral in nature, especially if she never got better. Could she come in? We can overbook.

## 2018-03-11 NOTE — Telephone Encounter (Signed)
pls see message and advise 

## 2018-03-11 NOTE — Telephone Encounter (Signed)
Called and spoke with patient and advised Dr. Artis FlockWolfe does not call in antibiotics, offered pt an appt on Monday 12/23 or next Thursday with Dr. Artis FlockWolfe and she declined.  I offered that we have a Saturday clinic at our [place] office or she could see one of our other providers next week if need be.  Pt verbalized understanding.  States that she has tried Claritin, Flonase, Robitussin and Mucinex w/no relief.

## 2018-03-30 DIAGNOSIS — Z1231 Encounter for screening mammogram for malignant neoplasm of breast: Secondary | ICD-10-CM | POA: Diagnosis not present

## 2018-03-31 ENCOUNTER — Other Ambulatory Visit: Payer: Self-pay | Admitting: Obstetrics and Gynecology

## 2018-03-31 DIAGNOSIS — R928 Other abnormal and inconclusive findings on diagnostic imaging of breast: Secondary | ICD-10-CM

## 2018-04-06 ENCOUNTER — Ambulatory Visit
Admission: RE | Admit: 2018-04-06 | Discharge: 2018-04-06 | Disposition: A | Discharge status: Home / Self Care | Payer: BLUE CROSS/BLUE SHIELD | Source: Ambulatory Visit | Attending: Obstetrics and Gynecology | Admitting: Obstetrics and Gynecology

## 2018-04-06 ENCOUNTER — Ambulatory Visit: Payer: BLUE CROSS/BLUE SHIELD

## 2018-04-06 DIAGNOSIS — R928 Other abnormal and inconclusive findings on diagnostic imaging of breast: Secondary | ICD-10-CM

## 2018-04-06 DIAGNOSIS — R922 Inconclusive mammogram: Secondary | ICD-10-CM | POA: Diagnosis not present

## 2018-10-10 DIAGNOSIS — Z6822 Body mass index (BMI) 22.0-22.9, adult: Secondary | ICD-10-CM | POA: Diagnosis not present

## 2018-10-10 DIAGNOSIS — Z01419 Encounter for gynecological examination (general) (routine) without abnormal findings: Secondary | ICD-10-CM | POA: Diagnosis not present

## 2019-05-30 LAB — HM MAMMOGRAPHY

## 2019-09-04 ENCOUNTER — Other Ambulatory Visit: Payer: Self-pay

## 2019-09-04 ENCOUNTER — Encounter: Payer: Self-pay | Admitting: Physician Assistant

## 2019-09-04 ENCOUNTER — Telehealth (INDEPENDENT_AMBULATORY_CARE_PROVIDER_SITE_OTHER): Payer: BLUE CROSS/BLUE SHIELD | Admitting: Physician Assistant

## 2019-09-04 VITALS — Ht 65.5 in | Wt 130.0 lb

## 2019-09-04 DIAGNOSIS — R05 Cough: Secondary | ICD-10-CM

## 2019-09-04 DIAGNOSIS — R059 Cough, unspecified: Secondary | ICD-10-CM

## 2019-09-04 MED ORDER — AZITHROMYCIN 250 MG PO TABS: ORAL_TABLET | ORAL | 0 refills | Status: DC

## 2019-09-04 NOTE — Progress Notes (Signed)
TELEPHONE ENCOUNTER   Patient verbally agreed to telephone visit and is aware that copayment and coinsurance may apply. Patient was treated using telemedicine according to accepted telemedicine protocols.  Location of the patient: home Location of provider: [place] [facility] [employer] of all persons participating in the telemedicine service and role in the encounter: Jarold Motto, [place] ; Vevelyn Francois  Subjective:   Chief Complaint  Patient presents with  . Cough  . Chest congestion     HPI   Cough Pt c/o hacking cough and chest congestion. Did teledoc visit 2 weeks ago was prescribed Prednisone x 7 days and Tessalon capsule. The prednisone helped with her cough. Pt is still having congestion, tightness and wheezing. Denies fever or chills currently.   Currently taking Claritin. Was taking Robitussin and Mucinex prior to oral prednisone. No hx of asthma.  Youngest in daycare and her daughter had the exact same symptoms but when she called the pediatrician they were not concerned. Close friend was diagnosed with bronchitis.   Denies: unexplained weight loss, coughing up blood  She is fully covid vaccinated.  Patient Active Problem List   Diagnosis Date Noted  . Headache 05/26/2013   Social History   Tobacco Use  . Smoking status: Former Smoker    Types: Cigarettes    Quit date: 09/12/2009    Years since quitting: 9.9  . Smokeless tobacco: Never Used  . Tobacco comment: Quit five years ago.  Substance Use Topics  . Alcohol use: No    Alcohol/week: 0.0 standard drinks    Comment: 7 glasses of wine per week    Current Outpatient Medications:  .  acetaminophen (TYLENOL) 500 MG tablet, Take 500 mg by mouth every 6 (six) hours as needed for headache., Disp: , Rfl:  .  ibuprofen (ADVIL,MOTRIN) 800 MG tablet, Take 1 tablet (800 mg total) by mouth every 8 (eight) hours as needed for moderate pain., Disp: 30 tablet, Rfl: 0 .  JUNEL FE 1/20 1-20 MG-MCG tablet, Take 1 tablet  by mouth daily., Disp: , Rfl: 3 .  azithromycin (ZITHROMAX Z-PAK) 250 MG tablet, Take 2 tablets ( total of 500 mg) PO on day 1, then 1 tablet ( total of 250 mg) PO q24 x 4 days., Disp: 6 each, Rfl: 0 .  fluticasone (FLONASE) 50 MCG/ACT nasal spray, Place 2 sprays into both nostrils daily. (Patient not taking: Reported on 09/04/2019), Disp: 16 g, Rfl: 6 No Known Allergies  Assessment & Plan:   1. Cough   No red flags on discussion  Will initiate azithromycin per orders to cover for possible bacterial cause of cough. Discussed taking medications as prescribed. Reviewed return precautions including worsening fever, SOB, worsening cough or other concerns. Push fluids and rest. I recommend that patient follow-up if symptoms worsen or persist despite treatment x 7-10 days, sooner if needed.   No orders of the defined types were placed in this encounter.  Meds ordered this encounter  Medications  . azithromycin (ZITHROMAX Z-PAK) 250 MG tablet    Sig: Take 2 tablets ( total of 500 mg) PO on day 1, then 1 tablet ( total of 250 mg) PO q24 x 4 days.    Dispense:  6 each    Refill:  0    Order Specific Question:   Supervising Provider    Answer:   Theodore Demark    Jarold Motto, PA 09/04/2019  Time spent with the patient: 10 minutes, spent in obtaining information about  her symptoms, reviewing her previous labs, evaluations, and treatments, counseling her about her condition (please see the discussed topics above), and developing a plan to further investigate it; she had a number of questions which I addressed.

## 2019-09-13 ENCOUNTER — Other Ambulatory Visit: Payer: Self-pay

## 2019-09-13 ENCOUNTER — Telehealth: Payer: Self-pay | Admitting: Family Medicine

## 2019-09-13 DIAGNOSIS — R059 Cough, unspecified: Secondary | ICD-10-CM

## 2019-09-13 NOTE — Telephone Encounter (Signed)
Patient notified

## 2019-09-13 NOTE — Telephone Encounter (Signed)
Pt called stating she saw Jarold Motto on 6/14 for congestion. Pt was prescribed a z-pack and was told if she had not gotten better then she needed to get a chest x-ray. Pt called asking if an order could be put in for her to go get an x-ray. Please advise.

## 2019-09-13 NOTE — Telephone Encounter (Signed)
CXR order has been put in. Will have to go to [place] for this.  I have ordered xrays for you. At this time we do not have xrays in our clinic. You will have to go to our [place] clinic. The address is [address]  xray is located in the basement.  Hours of operation are M-F 8:30am to 5:00pm.  Closed for lunch between 12:30 and 1:00pm.

## 2019-09-14 ENCOUNTER — Other Ambulatory Visit: Payer: Self-pay

## 2019-09-14 ENCOUNTER — Ambulatory Visit (INDEPENDENT_AMBULATORY_CARE_PROVIDER_SITE_OTHER)
Admission: RE | Admit: 2019-09-14 | Discharge: 2019-09-14 | Disposition: A | Discharge status: Home / Self Care | Payer: BLUE CROSS/BLUE SHIELD | Source: Ambulatory Visit | Attending: Family Medicine | Admitting: Family Medicine

## 2019-09-14 DIAGNOSIS — R05 Cough: Secondary | ICD-10-CM | POA: Diagnosis not present

## 2019-09-14 DIAGNOSIS — R059 Cough, unspecified: Secondary | ICD-10-CM

## 2019-09-15 ENCOUNTER — Telehealth: Payer: Self-pay | Admitting: Family Medicine

## 2019-09-15 NOTE — Telephone Encounter (Signed)
Scheduled pt to be seen in office on 09/22/2019

## 2019-09-15 NOTE — Telephone Encounter (Signed)
Pt called asking for chest x-ray results. Please advise.

## 2019-09-15 NOTE — Telephone Encounter (Signed)
Pt still complains of tightness in chest, but not as tight. She still has congestion, and complains of hoarseness. I recommended that she follow up in office with Dr. Artis Flock. Please schedule In office visit with pt.  Thank You

## 2019-09-15 NOTE — Telephone Encounter (Signed)
Please let her know xray is normal. If still coughing can follow up with me.  Dr. Artis Flock

## 2019-09-22 ENCOUNTER — Encounter: Payer: Self-pay | Admitting: Family Medicine

## 2019-09-22 ENCOUNTER — Other Ambulatory Visit: Payer: Self-pay

## 2019-09-22 ENCOUNTER — Ambulatory Visit (INDEPENDENT_AMBULATORY_CARE_PROVIDER_SITE_OTHER): Payer: BLUE CROSS/BLUE SHIELD | Admitting: Family Medicine

## 2019-09-22 VITALS — BP 100/60 | HR 70 | Temp 97.9°F | Ht 65.5 in | Wt 135.0 lb

## 2019-09-22 DIAGNOSIS — J208 Acute bronchitis due to other specified organisms: Secondary | ICD-10-CM | POA: Diagnosis not present

## 2019-09-22 MED ORDER — MONTELUKAST SODIUM 10 MG PO TABS
10.0000 mg | ORAL_TABLET | Freq: Every day | ORAL | 1 refills | Status: DC
Start: 2019-09-22 — End: 2019-12-20

## 2019-09-22 MED ORDER — ALBUTEROL SULFATE HFA 108 (90 BASE) MCG/ACT IN AERS
2.0000 | INHALATION_SPRAY | Freq: Four times a day (QID) | RESPIRATORY_TRACT | 1 refills | Status: DC | PRN
Start: 2019-09-22 — End: 2020-01-11

## 2019-09-22 NOTE — Patient Instructions (Signed)
Conservative therapy with cool mist humidifier at night, rest/fluids, and honey daily. Recommend 1 tablespoon/day. Also recommended over the counter anti tussive medication: robitussin DM during the day and could do a nyquil at night.   I want you to do flonase at night. This will help post nasal drip and congestion you feel in your chest in the AM. Also sending in inhaler to help with chest tightness.   This can take 6 weeks, so you have about 1-2 more weeks and hopefully this will be done.   Acute Bronchitis, Adult  Acute bronchitis is when air tubes in the lungs (bronchi) suddenly get swollen. The condition can make it hard for you to breathe. In adults, acute bronchitis usually goes away within 2 weeks. A cough caused by bronchitis may last up to 3 weeks. Smoking, allergies, and asthma can make the condition worse. What are the causes? This condition is caused by:  Cold and flu viruses. The most common cause of this condition is the virus that causes the common cold.  Bacteria.  Substances that irritate the lungs, including: ? Smoke from cigarettes and other types of tobacco. ? Dust and pollen. ? Fumes from chemicals, gases, or burned fuel. ? Other materials that pollute indoor or outdoor air.  Close contact with someone who has acute bronchitis. What increases the risk? The following factors may make you more likely to develop this condition:  A weak body's defense system. This is also called the immune system.  Any condition that affects your lungs and breathing, such as asthma. What are the signs or symptoms? Symptoms of this condition include:  A cough.  Coughing up clear, yellow, or green mucus.  Wheezing.  Chest congestion.  Shortness of breath.  A fever.  Body aches.  Chills.  A sore throat. How is this treated? Acute bronchitis may go away over time without treatment. Your doctor may recommend:  Drinking more fluids.  Taking a medicine for a fever or  cough.  Using a device that gets medicine into your lungs (inhaler).  Using a vaporizer or a humidifier. These are machines that add water or moisture in the air to help with coughing and poor breathing. Follow these instructions at home:  Activity  Get a lot of rest.  Avoid places where there are fumes from chemicals.  Return to your normal activities as told by your doctor. Ask your doctor what activities are safe for you. Lifestyle  Drink enough fluids to keep your pee (urine) pale yellow.  Do not drink alcohol.  Do not use any products that contain nicotine or tobacco, such as cigarettes, e-cigarettes, and chewing tobacco. If you need help quitting, ask your doctor. Be aware that: ? Your bronchitis will get worse if you smoke or breathe in other people's smoke (secondhand smoke). ? Your lungs will heal faster if you quit smoking. General instructions  Take over-the-counter and prescription medicines only as told by your doctor.  Use an inhaler, cool mist vaporizer, or humidifier as told by your doctor.  Rinse your mouth often with salt water. To make salt water, dissolve -1 tsp (3-6 g) of salt in 1 cup (237 mL) of warm water.  Keep all follow-up visits as told by your doctor. This is important. How is this prevented? To lower your risk of getting this condition again:  Wash your hands often with soap and water. If soap and water are not available, use hand sanitizer.  Avoid contact with people who have cold  symptoms.  Try not to touch your mouth, nose, or eyes with your hands.  Make sure to get the flu shot every year. Contact a doctor if:  Your symptoms do not get better in 2 weeks.  You vomit more than once or twice.  You have symptoms of loss of fluid from your body (dehydration). These include: ? Dark urine. ? Dry skin or eyes. ? Increased thirst. ? Headaches. ? Confusion. ? Muscle cramps. Get help right away if:  You cough up blood.  You have chest  pain.  You have very bad shortness of breath.  You become dehydrated.  You faint or keep feeling like you are going to faint.  You keep vomiting.  You have a very bad headache.  Your fever or chills get worse. These symptoms may be an emergency. Do not wait to see if the symptoms will go away. Get medical help right away. Call your local emergency services (911 in the [place] Do not drive yourself to the hospital. Summary  Acute bronchitis is when air tubes in the lungs (bronchi) suddenly get swollen. In adults, acute bronchitis usually goes away within 2 weeks.  Take over-the-counter and prescription medicines only as told by your doctor.  Drink enough fluid to keep your pee (urine) pale yellow.  Contact a doctor if your symptoms do not improve after 2 weeks of treatment.  Get help right away if you cough up blood, faint, or have chest pain or shortness of breath. This information is not intended to replace advice given to you by your health care provider. Make sure you discuss any questions you have with your health care provider. Document Revised: 09/30/2018 Document Reviewed: 09/30/2018 Elsevier Patient Education  2020 [employer].

## 2019-09-22 NOTE — Progress Notes (Signed)
Patient: Amber Hendricks MRN: [id number] DOB: [birth year] PCP: Orland Mustard, MD     Subjective:  Chief Complaint  Patient presents with  . URI  . Hoarse    HPI: The patient is a 44 y.o. female who presents today for URI symptoms. She says that she is actually feeling a lot better. This has been going on since May 28th. She says that the Z pack and prednisone really did not help. She started on Mucinex, and switched from Claritin to Zyrtec and started getting relief. She complains of mild chest tightness, but says she is fine. She feels like she does not get enough sleep due to her 42 yr old waking her up at night, so she has not been able to heal. Her neighbor was diagnosed with bronchitis and she has the exact same symptoms. She states she is feeling better and her chest is no longer tight like it used to be. Cough is much improved and is hardly at all. She just feels congested, mainly in the AM. Not using flonase.   She can't take any night time cough medication as her 42 year old wakes up every night and she doesn't feel like she can wake up enough to help him.    Allergies are bad. On zyrtec daily. Not using flonase. Never been on singulair.    Has had her covid vaccines.   Review of Systems  Eyes: Negative for visual disturbance.  Respiratory: Positive for cough. Negative for shortness of breath and wheezing.   Cardiovascular: Negative for chest pain and palpitations.  Neurological: Positive for headaches. Negative for dizziness and light-headedness.    Allergies Patient has No Known Allergies.  Past Medical History Patient  has a past medical history of Allergy, Anxiety, Chronic tension headaches, Cysts of both ovaries, Former smoker, Frequent headaches, History of chicken pox, History of migraine headaches, Hyperlipidemia, Migraines, and UTI (urinary tract infection).  Surgical History Patient  has a past surgical history that includes Wisdom tooth extraction.  Family  History Pateint's family history includes Colon cancer in her maternal uncle; Dementia in her paternal grandfather and paternal grandmother; Diabetes in her maternal grandfather; Early death in her paternal grandfather; Hearing loss in her maternal grandmother and paternal grandmother; Heart attack in her father and paternal grandfather; Heart disease in her maternal grandfather and paternal grandfather; Hyperlipidemia in her mother; Hypertension in her father and maternal grandmother; Osteoarthritis in her father; Osteoporosis in her mother.  Social History Patient  reports that she quit smoking about 10 years ago. Her smoking use included cigarettes. She has never used smokeless tobacco. She reports that she does not drink alcohol and does not use drugs.    Objective: Vitals:   09/22/19 0921  BP: 100/60  Pulse: 70  Temp: 97.9 F (36.6 C)  TempSrc: Temporal  SpO2: 98%  Weight: 135 lb (61.2 kg)  Height: 5' 5.5" (1.664 m)    Body mass index is 22.12 kg/m.  Physical Exam Vitals reviewed.  Constitutional:      Appearance: Normal appearance. She is well-developed and normal weight.  HENT:     Head: Normocephalic and atraumatic.     Right Ear: Tympanic membrane, ear canal and external ear normal.     Left Ear: Tympanic membrane, ear canal and external ear normal.     Nose: Nose normal.     Mouth/Throat:     Pharynx: No oropharyngeal exudate.  Eyes:     Conjunctiva/sclera: Conjunctivae normal.  Cardiovascular:  Rate and Rhythm: Normal rate and regular rhythm.     Heart sounds: Normal heart sounds.  Pulmonary:     Effort: Pulmonary effort is normal.     Breath sounds: Normal breath sounds. No wheezing or rales.  Abdominal:     General: Bowel sounds are normal.     Palpations: Abdomen is soft.  Musculoskeletal:     Cervical back: Normal range of motion and neck supple.  Lymphadenopathy:     Cervical: No cervical adenopathy.  Skin:    Capillary Refill: Capillary refill  takes less than 2 seconds.  Neurological:     General: No focal deficit present.     Mental Status: She is alert and oriented to person, place, and time.  Psychiatric:        Mood and Affect: Mood normal.        Behavior: Behavior normal.    CXR: wnl     Assessment/plan: 1. Acute bronchitis, viral She has improved and discussed that bronchitis can last up ot 6 weeks. Recent CXR clear with no acute findings. Reassured her today.  I think she has a component of allergies  As well and want her to start back her flonase at night. Continue zyrtec in the AM and sending in singulair at night. She may consider an allergy referral in the future and is to let me know.  As for her bronchitis recommended conservative therapy with honey, cool mist humidifier and trial of an albuterol inhaler. Discussed with her that this is likely viral and not surprised the steroids and antibiotics that she was given did not help her. If cough persists past 6 weeks consider pulm referral for [facility]. Discussed how to use albuterol inhaler.   Overdue for annual.    This visit occurred during the SARS-CoV-2 public health emergency.  Safety protocols were in place, including screening questions prior to the visit, additional usage of staff PPE, and extensive cleaning of exam room while observing appropriate contact time as indicated for disinfecting solutions.     Return in about 3 months (around 12/23/2019) for overdue for annual! .   Orland Mustard, MD [facility]   09/22/2019

## 2019-12-14 ENCOUNTER — Telehealth: Payer: Self-pay

## 2019-12-14 NOTE — Telephone Encounter (Signed)
Okay to schedule appointment.  Thank You

## 2019-12-14 NOTE — Telephone Encounter (Signed)
Pt believes she was bitten over a week ago on her breast. It was very small spot and itchy. Pt noticed 4 days ago that the spot had grown to the size of a dime. Spot looks pink with a light brown center. The spot is located to the left of the nipple. The itchiness has decreased, but last night the pt starting to experience dizziness. Next available appt with Dr. Artis Flock is next Wed for a sameday. Please Advise

## 2019-12-19 ENCOUNTER — Telehealth: Payer: BLUE CROSS/BLUE SHIELD | Admitting: Physician Assistant

## 2019-12-20 ENCOUNTER — Other Ambulatory Visit: Payer: Self-pay

## 2019-12-20 ENCOUNTER — Encounter: Payer: Self-pay | Admitting: Physician Assistant

## 2019-12-20 ENCOUNTER — Other Ambulatory Visit: Payer: Self-pay | Admitting: *Deleted

## 2019-12-20 ENCOUNTER — Ambulatory Visit (INDEPENDENT_AMBULATORY_CARE_PROVIDER_SITE_OTHER): Payer: BLUE CROSS/BLUE SHIELD | Admitting: Physician Assistant

## 2019-12-20 ENCOUNTER — Ambulatory Visit: Payer: BLUE CROSS/BLUE SHIELD | Admitting: Family Medicine

## 2019-12-20 VITALS — BP 120/86 | HR 71 | Temp 97.6°F | Ht 65.5 in | Wt 135.0 lb

## 2019-12-20 DIAGNOSIS — F419 Anxiety disorder, unspecified: Secondary | ICD-10-CM

## 2019-12-20 DIAGNOSIS — R42 Dizziness and giddiness: Secondary | ICD-10-CM

## 2019-12-20 MED ORDER — BUSPIRONE HCL 5 MG PO TABS: 5.0000 mg | ORAL_TABLET | Freq: Three times a day (TID) | ORAL | 1 refills | Status: DC | PRN

## 2019-12-20 NOTE — Progress Notes (Signed)
Amber Hendricks is a 44 y.o. female here for a new problem.  I acted as a Neurosurgeon for [employer], PA-C Corky Mull, LPN   History of Present Illness:   Chief Complaint  Patient presents with  . Dizziness    HPI    Dizziness Pt c/o dizziness Thursday through Sunday. Has resolved. Pt had Migraine headache and ears were popping. If moved her head quickly or laid down quickly she would feel like the room was spinning. Vertigo runs in her family. She thinks that she has experienced this in the past that has gone away on its own.  Denies: changes in vision, double vision, nausea, vomiting, slurred speech, changes in balance, unusual/severe headaches, lightheadedness, chest pain, SOB  In the last 4 weeks she has returned to work. 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 She has very limited family support.  History of panic attacks. Xanax prn was given to her from ob-gyn in August. She has taken two total. Felt like it was very sedating. Trialed zoloft in the past and didn't like the way it made her feel, felt like a "zombie."  Depression screen [facility] 2/9 12/20/2019 09/22/2019  Decreased Interest 0 0  Down, Depressed, Hopeless 0 0  PHQ - 2 Score 0 0  Altered sleeping 0 -  Tired, decreased energy 1 -  Change in appetite 0 -  Feeling bad or failure about yourself  1 -  Trouble concentrating 0 -  Moving slowly or fidgety/restless 2 -  Suicidal thoughts 0 -  PHQ-9 Score 4 -  Difficult doing work/chores Somewhat difficult -   GAD 7 : Generalized Anxiety Score 12/20/2019  Nervous, Anxious, on Edge 2  Control/stop worrying 2  Worry too much - different things 2  Trouble relaxing 3  Restless 1  Easily annoyed or irritable 1  Afraid - awful might happen 0  Total GAD 7 Score 11  Anxiety Difficulty Somewhat difficult        Past Medical History:  Diagnosis Date  . Allergy   . Anxiety   . 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 Cysts of both ovaries   . Former smoker   . Frequent headaches   . History of chicken pox   . History of migraine headaches    Menstual- no aura  . Hyperlipidemia   . Migraines   . UTI (urinary tract infection)      Social History   Tobacco Use  . Smoking status: Former Smoker    Types: Cigarettes    Quit date: 09/12/2009    Years since quitting: 10.2  . Smokeless tobacco: Never Used  . Tobacco comment: Quit five years ago.  Vaping Use  . Vaping Use: Never used  Substance Use Topics  . Alcohol use: No    Alcohol/week: 0.0 standard drinks    Comment: 7 glasses of wine per week  . Drug use: No    Past Surgical History:  Procedure Laterality Date  . WISDOM TOOTH EXTRACTION      Family History  Problem Relation Age of Onset  . Osteoarthritis Father        (neck and shoulders)  . Heart attack Father   . Hypertension Father   . Osteoporosis Mother   . Hyperlipidemia Mother   . Heart disease Paternal Grandfather   . Dementia Paternal Grandfather   . Early death Paternal Grandfather   .  Heart attack Paternal Grandfather   . Dementia Paternal Grandmother   . Hearing loss Paternal Grandmother   . Hearing loss Maternal Grandmother   . Hypertension Maternal Grandmother   . Diabetes Maternal Grandfather   . Heart disease Maternal Grandfather   . Colon cancer Maternal Uncle     No Known Allergies  Current Medications:   Current Outpatient Medications:  .  acetaminophen (TYLENOL) 500 MG tablet, Take 500 mg by mouth every 6 (six) hours as needed for headache., Disp: , Rfl:  .  albuterol (VENTOLIN HFA) 108 (90 Base) MCG/ACT inhaler, Inhale 2 puffs into the lungs every 6 (six) hours as needed for wheezing or shortness of breath., Disp: 18 g, Rfl: 1 .  JUNEL FE 1/20 1-20 MG-MCG tablet, Take 1 tablet by mouth daily., Disp: , Rfl: 3 .  loratadine (CLARITIN) 10 MG tablet, Take 10 mg by mouth daily., Disp: , Rfl:  .  ALPRAZolam  (XANAX) 0.5 MG tablet, alprazolam 0.5 mg tablet  TAKE 1 TABLET AS NEEDED, Disp: , Rfl:  .  busPIRone (BUSPAR) 5 MG tablet, Take 1 tablet (5 mg total) by mouth 3 (three) times daily as needed., Disp: 60 tablet, Rfl: 1   Review of Systems:   ROS Negative unless otherwise specified per HPI.  Vitals:   Vitals:   12/20/19 1438  BP: 120/86  Pulse: 71  Temp: 97.6 F (36.4 C)  TempSrc: Temporal  SpO2: 99%  Weight: 135 lb (61.2 kg)  Height: 5' 5.5" (1.664 m)     Body mass index is 22.12 kg/m.  Physical Exam:   Physical Exam Vitals and nursing note reviewed.  Constitutional:      General: She is not in acute distress.    Appearance: She is well-developed. She is not ill-appearing or toxic-appearing.  Cardiovascular:     Rate and Rhythm: Normal rate and regular rhythm.     Pulses: Normal pulses.     Heart sounds: Normal heart sounds, S1 normal and S2 normal.     Comments: No LE edema Pulmonary:     Effort: Pulmonary effort is normal.     Breath sounds: Normal breath sounds.  Skin:    General: Skin is warm and dry.  Neurological:     General: No focal deficit present.     Mental Status: She is alert.     GCS: GCS eye subscore is 4. GCS verbal subscore is 5. GCS motor subscore is 6.     Cranial Nerves: Cranial nerves are intact.     Sensory: Sensation is intact.     Motor: Motor function is intact.     Coordination: Coordination is intact.     Gait: Gait is intact.  Psychiatric:        Speech: Speech normal.        Behavior: Behavior normal. Behavior is cooperative.       Assessment and Plan:   Amber Hendricks was seen today for dizziness.  Diagnoses and all orders for this visit:  Dizziness Resolved. Sounds like an episode of BPPV. No red flags on exam, neuro exam benign. Provided handout for [facility] Maneuvers should this return. Also recommended consideration of urgent PT referral for vestibular rehab if it occurs again. Did offer labwork since it has been almost two  years, but she declined. Worsening precautions advised.  Anxiety Uncontrolled. Reviewed SSRIs vs prn medication options (benzos vs inderal vs buspar). We agreed to trial 5 mg Buspar TID prn. Xanax already prescribed by ob-gyn, but if  needs fill, recommend longer-acting such as ativan to help prevent severe drowsiness. Follow-up with PCP in 2-4 weeks, sooner if concerns. Recommended counseling, she will think about this.  CMA or LPN served as scribe during this visit. History, Physical, and Plan performed by medical provider. The above documentation has been reviewed and is accurate and complete.  Jarold Motto, PA-C

## 2019-12-20 NOTE — Patient Instructions (Signed)
It was great to see you!  Buspar 5 mg three times daily as needed for anxiety  Look up propranolol in case you are interested in this (this can be used for performance anxiety/social anxiety)  Follow-up with Dr. Artis Flock in 2-4 weeks, sooner if concerns.  See exercises to trial for vertigo if this returns.   Take care,  Jarold Motto PA-C

## 2019-12-27 ENCOUNTER — Encounter: Payer: BLUE CROSS/BLUE SHIELD | Admitting: Family Medicine

## 2020-01-11 ENCOUNTER — Other Ambulatory Visit: Payer: Self-pay

## 2020-01-11 ENCOUNTER — Encounter: Payer: Self-pay | Admitting: Family Medicine

## 2020-01-11 ENCOUNTER — Ambulatory Visit (INDEPENDENT_AMBULATORY_CARE_PROVIDER_SITE_OTHER): Payer: BLUE CROSS/BLUE SHIELD | Admitting: Family Medicine

## 2020-01-11 VITALS — BP 127/85 | HR 80 | Temp 98.6°F | Ht 65.5 in | Wt 135.0 lb

## 2020-01-11 DIAGNOSIS — Z0000 Encounter for general adult medical examination without abnormal findings: Secondary | ICD-10-CM

## 2020-01-11 DIAGNOSIS — Z1159 Encounter for screening for other viral diseases: Secondary | ICD-10-CM

## 2020-01-11 DIAGNOSIS — J029 Acute pharyngitis, unspecified: Secondary | ICD-10-CM | POA: Diagnosis not present

## 2020-01-11 DIAGNOSIS — J302 Other seasonal allergic rhinitis: Secondary | ICD-10-CM

## 2020-01-11 DIAGNOSIS — F418 Other specified anxiety disorders: Secondary | ICD-10-CM

## 2020-01-11 NOTE — Patient Instructions (Addendum)
-for allergies continue with claritin in the AM and start your singulair at night (montelukast). Also start flonase at night. If nose is not better let me know will add on px nasal spray.   -covid test, would quarantine until this is back.    Preventive Care 4-42 Years Old, Female Preventive care refers to visits with your health care provider and lifestyle choices that can promote health and wellness. This includes:  A yearly physical exam. This may also be called an annual well check.  Regular dental visits and eye exams.  Immunizations.  Screening for certain conditions.  Healthy lifestyle choices, such as eating a healthy diet, getting regular exercise, not using drugs or products that contain nicotine and tobacco, and limiting alcohol use. What can I expect for my preventive care visit? Physical exam Your health care provider will check your:  Height and weight. This may be used to calculate body mass index (BMI), which tells if you are at a healthy weight.  Heart rate and blood pressure.  Skin for abnormal spots. Counseling Your health care provider may ask you questions about your:  Alcohol, tobacco, and drug use.  Emotional well-being.  Home and relationship well-being.  Sexual activity.  Eating habits.  Work and work Statistician.  Method of birth control.  Menstrual cycle.  Pregnancy history. What immunizations do I need?  Influenza (flu) vaccine  This is recommended every year. Tetanus, diphtheria, and pertussis (Tdap) vaccine  You may need a Td booster every 10 years. Varicella (chickenpox) vaccine  You may need this if you have not been vaccinated. Zoster (shingles) vaccine  You may need this after age 13. Measles, mumps, and rubella (MMR) vaccine  You may need at least one dose of MMR if you were born in 1957 or later. You may also need a second dose. Pneumococcal conjugate (PCV13) vaccine  You may need this if you have certain conditions  and were not previously vaccinated. Pneumococcal polysaccharide (PPSV23) vaccine  You may need one or two doses if you smoke cigarettes or if you have certain conditions. Meningococcal conjugate (MenACWY) vaccine  You may need this if you have certain conditions. Hepatitis A vaccine  You may need this if you have certain conditions or if you travel or work in places where you may be exposed to hepatitis A. Hepatitis B vaccine  You may need this if you have certain conditions or if you travel or work in places where you may be exposed to hepatitis B. Haemophilus influenzae type b (Hib) vaccine  You may need this if you have certain conditions. Human papillomavirus (HPV) vaccine  If recommended by your health care provider, you may need three doses over 6 months. You may receive vaccines as individual doses or as more than one vaccine together in one shot (combination vaccines). Talk with your health care provider about the risks and benefits of combination vaccines. What tests do I need? Blood tests  Lipid and cholesterol levels. These may be checked every 5 years, or more frequently if you are over 68 years old.  Hepatitis C test.  Hepatitis B test. Screening  Lung cancer screening. You may have this screening every year starting at age 101 if you have a 30-pack-year history of smoking and currently smoke or have quit within the past 15 years.  Colorectal cancer screening. All adults should have this screening starting at age 62 and continuing until age 47. Your health care provider may recommend screening at age 78 if  you are at increased risk. You will have tests every 1-10 years, depending on your results and the type of screening test.  Diabetes screening. This is done by checking your blood sugar (glucose) after you have not eaten for a while (fasting). You may have this done every 1-3 years.  Mammogram. This may be done every 1-2 years. Talk with your health care provider  about when you should start having regular mammograms. This may depend on whether you have a family history of breast cancer.  BRCA-related cancer screening. This may be done if you have a family history of breast, ovarian, tubal, or peritoneal cancers.  Pelvic exam and Pap test. This may be done every 3 years starting at age 46. Starting at age 43, this may be done every 5 years if you have a Pap test in combination with an HPV test. Other tests  Sexually transmitted disease (STD) testing.  Bone density scan. This is done to screen for osteoporosis. You may have this scan if you are at high risk for osteoporosis. Follow these instructions at home: Eating and drinking  Eat a diet that includes fresh fruits and vegetables, whole grains, lean protein, and low-fat dairy.  Take vitamin and mineral supplements as recommended by your health care provider.  Do not drink alcohol if: ? Your health care provider tells you not to drink. ? You are pregnant, may be pregnant, or are planning to become pregnant.  If you drink alcohol: ? Limit how much you have to 0-1 drink a day. ? Be aware of how much alcohol is in your drink. In the [place] one drink equals one 12 oz bottle of beer (355 mL), one 5 oz glass of wine (148 mL), or one 1 oz glass of hard liquor (44 mL). Lifestyle  Take daily care of your teeth and gums.  Stay active. Exercise for at least 30 minutes on 5 or more days each week.  Do not use any products that contain nicotine or tobacco, such as cigarettes, e-cigarettes, and chewing tobacco. If you need help quitting, ask your health care provider.  If you are sexually active, practice safe sex. Use a condom or other form of birth control (contraception) in order to prevent pregnancy and STIs (sexually transmitted infections).  If told by your health care provider, take low-dose aspirin daily starting at age 34. What's next?  Visit your health care provider once a year for a well  check visit.  Ask your health care provider how often you should have your eyes and teeth checked.  Stay up to date on all vaccines. This information is not intended to replace advice given to you by your health care provider. Make sure you discuss any questions you have with your health care provider. Document Revised: 11/18/2017 Document Reviewed: 11/18/2017 Elsevier Patient Education  2020 [employer].

## 2020-01-11 NOTE — Progress Notes (Signed)
Patient: Amber Hendricks MRN: [id number] DOB: Mar 13, 1976 PCP: Orma Flaming, MD     Subjective:  Chief Complaint  Patient presents with  . Annual Exam  . Sore Throat  . Cough  . Dizziness    f/u    HPI: The patient is a 44 y.o. female who presents today for annual exam and complaints of sore throat, cough and f/u for dizziness. She denies any changes to past medical history. There have been no recent hospitalizations. They are not following a well balanced diet and exercise plan. Weight has been stable. She complains of cough due to allergies and sore throat due to coughing.   No breast cancer or colon cancer in first degree relative.   Dizziness has resolved.   Situational anxiety -started on buspar 77m Tid/prn. And she used this as needed and it helped a lot. She has not needed and only used during the funeral instead of xanax. No side effects and worked really well for her. Happy with prn use.   Sore throat and nasal drainage and cough -sore throat is likely from her clearing her throat. She has been coughing x 4 days. Her allergies are so bad this time of year. She has a very mild productive cough. No fever/chills. No shortness of breath/wheezing. She has not been using her flonase at all. She is on claritin daily. I gave her singulair in the past and she thinks she has this at home, has not started this because she wanted to ask me. Has been vaccinated for covid. No covid contacts.    Immunization History  Administered Date(s) Administered  . Influenza,inj,Quad PF,6+ Mos 12/27/2017  . MMR 03/14/2015  . PFIZER SARS-COV-2 Vaccination 06/02/2019, 06/23/2019   Colonoscopy: routine screening  Mammogram: utd, 2021. Normal. Do not have records.  Pap smear: 09/22/2017  Review of Systems  Constitutional: Negative for chills, fatigue and fever.  HENT: Positive for congestion and sore throat. Negative for dental problem, ear pain, hearing loss and trouble swallowing.   Eyes:  Negative for visual disturbance.  Respiratory: Positive for cough. Negative for chest tightness, shortness of breath and wheezing.   Cardiovascular: Negative for chest pain, palpitations and leg swelling.  Gastrointestinal: Negative for abdominal pain, blood in stool, diarrhea and nausea.  Endocrine: Negative for cold intolerance, polydipsia, polyphagia and polyuria.  Genitourinary: Negative for dysuria and hematuria.  Musculoskeletal: Negative for arthralgias.  Skin: Negative for rash.  Neurological: Negative for dizziness, light-headedness and headaches.  Psychiatric/Behavioral: Negative for dysphoric mood and sleep disturbance. The patient is not nervous/anxious.     Allergies Patient has No Known Allergies.  Past Medical History Patient  has a past medical history of Allergy, Anxiety, Chronic tension headaches, Cysts of both ovaries, Former smoker, Frequent headaches, History of chicken pox, History of migraine headaches, Hyperlipidemia, Migraines, and UTI (urinary tract infection).  Surgical History Patient  has a past surgical history that includes Wisdom tooth extraction.  Family History Pateint's family history includes Colon cancer in her maternal uncle; Dementia in her paternal grandfather and paternal grandmother; Diabetes in her maternal grandfather; Early death in her paternal grandfather; Hearing loss in her maternal grandmother and paternal grandmother; Heart attack in her father and paternal grandfather; Heart disease in her maternal grandfather and paternal grandfather; Hyperlipidemia in her mother; Hypertension in her father and maternal grandmother; Osteoarthritis in her father; Osteoporosis in her mother.  Social History Patient  reports that she quit smoking about 10 years ago. Her smoking use included cigarettes. She has  never used smokeless tobacco. She reports that she does not drink alcohol and does not use drugs.    Objective: Vitals:   01/11/20 1000  BP:  127/85  Pulse: 80  Temp: 98.6 F (37 C)  TempSrc: Temporal  SpO2: 99%  Weight: 135 lb (61.2 kg)  Height: 5' 5.5" (1.664 m)    Body mass index is 22.12 kg/m.  Physical Exam Vitals reviewed.  Constitutional:      Appearance: She is well-developed and normal weight.  HENT:     Head: Normocephalic and atraumatic.     Right Ear: Tympanic membrane, ear canal and external ear normal.     Left Ear: Tympanic membrane, ear canal and external ear normal.     Nose: Rhinorrhea present.     Mouth/Throat:     Mouth: Mucous membranes are moist.     Comments: +cobblestoning on posterior pharynx  Eyes:     Conjunctiva/sclera: Conjunctivae normal.     Pupils: Pupils are equal, round, and reactive to light.  Neck:     Thyroid: No thyromegaly.  Cardiovascular:     Rate and Rhythm: Normal rate and regular rhythm.     Heart sounds: Normal heart sounds. No murmur heard.   Pulmonary:     Effort: Pulmonary effort is normal.     Breath sounds: Normal breath sounds.  Abdominal:     General: Bowel sounds are normal. There is no distension.     Palpations: Abdomen is soft.     Tenderness: There is no abdominal tenderness.  Musculoskeletal:     Cervical back: Normal range of motion and neck supple.  Lymphadenopathy:     Cervical: No cervical adenopathy.  Skin:    General: Skin is warm and dry.     Capillary Refill: Capillary refill takes less than 2 seconds.     Findings: No rash.  Neurological:     General: No focal deficit present.     Mental Status: She is alert and oriented to person, place, and time.     Cranial Nerves: No cranial nerve deficit.     Coordination: Coordination normal.     Deep Tendon Reflexes: Reflexes normal.  Psychiatric:        Mood and Affect: Mood normal.        Behavior: Behavior normal.          Office Visit from 12/20/2019 in [place]  PHQ-2 Total Score 0      Assessment/plan: 1. Annual physical exam HM reviewed. Fasting labs  today. Has had her mmg and wants to wait on flu shot until she feels better. Encouraged exercise. Overall doing well. F/u in one year or as needed.  Patient counseling _0    Nutrition: Stressed importance of moderation in sodium/caffeine intake, saturated fat and cholesterol, caloric balance, sufficient intake of fresh fruits, vegetables, fiber, calcium, iron, and 1 mg of folate supplement per day (for females capable of pregnancy).  _1    Stressed the importance of regular exercise.   _2    Substance Abuse: Discussed cessation/primary prevention of tobacco, alcohol, or other drug use; driving or other dangerous activities under the influence; availability of treatment for abuse.   _3    Injury prevention: Discussed safety belts, safety helmets, smoke detector, smoking near bedding or upholstery.   _4    Sexuality: Discussed sexually transmitted diseases, partner selection, use of condoms, avoidance of unintended pregnancy  and contraceptive alternatives.  _5    Dental health: Discussed importance of regular tooth brushing, flossing,  and dental visits.  _0    Health maintenance and immunizations reviewed. Please refer to Health maintenance section.    - CBC with Differential/Platelet; Future - Lipid panel; Future - TSH; Future - COMPLETE METABOLIC PANEL WITH GFR; Future  2. Sore throat Likely secondary to allergies and PND. Very low clinical suspicion for covid, but will test her. Recommended she start flonase nightly for PND, claritin and adding on Singulair at night.  - Novel Coronavirus, NAA ([facility]); Future  3. Encounter for hepatitis C screening test for low risk patient  - Hepatitis C antibody; Future  4. Situational anxiety Resolved. Only needed for funeral and buspar was great. Has not needed any since that time.   5. Seasonal allergies claritin in AM, adding on singulair in Pm and want her to start back her flonase at night as well. Let me know if still having drainage and we can  add on astelin spray in the AM.   This visit occurred during the SARS-CoV-2 public health emergency.  Safety protocols were in place, including screening questions prior to the visit, additional usage of staff PPE, and extensive cleaning of exam room while observing appropriate contact time as indicated for disinfecting solutions.     Return in about 1 year (around 01/10/2021) for annual .     Orma Flaming, MD [place]  01/11/2020

## 2020-01-15 LAB — TSH: TSH: 1.36 mIU/L

## 2020-01-15 LAB — CBC WITH DIFFERENTIAL/PLATELET
Absolute Monocytes: 314 cells/uL (ref 200–950)
Basophils Absolute: 19 cells/uL (ref 0–200)
Basophils Relative: 0.3 %
Eosinophils Absolute: 90 cells/uL (ref 15–500)
Eosinophils Relative: 1.4 %
HCT: 39.6 % (ref 35.0–45.0)
Hemoglobin: 13.5 g/dL (ref 11.7–15.5)
Lymphs Abs: 1600 cells/uL (ref 850–3900)
MCH: 31.3 pg (ref 27.0–33.0)
MCHC: 34.1 g/dL (ref 32.0–36.0)
MCV: 91.9 fL (ref 80.0–100.0)
MPV: 10.2 fL (ref 7.5–12.5)
Monocytes Relative: 4.9 %
Neutro Abs: 4378 cells/uL (ref 1500–7800)
Neutrophils Relative %: 68.4 %
Platelets: 284 10*3/uL (ref 140–400)
RBC: 4.31 10*6/uL (ref 3.80–5.10)
RDW: 12.1 % (ref 11.0–15.0)
Total Lymphocyte: 25 %
WBC: 6.4 10*3/uL (ref 3.8–10.8)

## 2020-01-15 LAB — LIPID PANEL
Cholesterol: 209 mg/dL — ABNORMAL HIGH (ref <200)
HDL: 75 mg/dL (ref >=50)
LDL Cholesterol (Calc): 109 mg/dL (calc) — ABNORMAL HIGH
Non-HDL Cholesterol (Calc): 134 mg/dL (calc) — ABNORMAL HIGH (ref <130)
Total CHOL/HDL Ratio: 2.8 (calc) (ref <5.0)
Triglycerides: 131 mg/dL (ref <150)

## 2020-01-15 LAB — COMPLETE METABOLIC PANEL WITH GFR
AG Ratio: 2 (calc) (ref 1.0–2.5)
ALT: 22 U/L (ref 6–29)
AST: 15 U/L (ref 10–30)
Albumin: 4.5 g/dL (ref 3.6–5.1)
Alkaline phosphatase (APISO): 48 U/L (ref 31–125)
BUN: 9 mg/dL (ref 7–25)
CO2: 26 mmol/L (ref 20–32)
Calcium: 9.5 mg/dL (ref 8.6–10.2)
Chloride: 105 mmol/L (ref 98–110)
Creat: 0.81 mg/dL (ref 0.50–1.10)
GFR, Est African American: 104 mL/min/{1.73_m2} (ref >=60)
GFR, Est Non African American: 90 mL/min/{1.73_m2} (ref >=60)
Globulin: 2.3 g/dL (calc) (ref 1.9–3.7)
Glucose, Bld: 103 mg/dL — ABNORMAL HIGH (ref 65–99)
Potassium: 4.1 mmol/L (ref 3.5–5.3)
Sodium: 140 mmol/L (ref 135–146)
Total Bilirubin: 0.5 mg/dL (ref 0.2–1.2)
Total Protein: 6.8 g/dL (ref 6.1–8.1)

## 2020-01-15 LAB — HEPATITIS C ANTIBODY
Hepatitis C Ab: NONREACTIVE
SIGNAL TO CUT-OFF: 0.01 (ref <1.00)

## 2020-01-15 NOTE — Addendum Note (Signed)
Addended by: Janeece Riggers D on: 01/15/2020 11:18 AM   Modules accepted: Orders

## 2020-01-16 LAB — NOVEL CORONAVIRUS, NAA: SARS-CoV-2, NAA: NOT DETECTED

## 2020-07-03 ENCOUNTER — Other Ambulatory Visit: Payer: Self-pay | Admitting: Obstetrics and Gynecology

## 2020-07-03 DIAGNOSIS — R928 Other abnormal and inconclusive findings on diagnostic imaging of breast: Secondary | ICD-10-CM

## 2020-07-08 ENCOUNTER — Other Ambulatory Visit: Payer: Self-pay

## 2020-07-08 ENCOUNTER — Ambulatory Visit: Payer: BLUE CROSS/BLUE SHIELD

## 2020-07-08 ENCOUNTER — Ambulatory Visit
Admission: RE | Admit: 2020-07-08 | Discharge: 2020-07-08 | Disposition: A | Discharge status: Home / Self Care | Payer: BLUE CROSS/BLUE SHIELD | Source: Ambulatory Visit | Attending: Obstetrics and Gynecology | Admitting: Obstetrics and Gynecology

## 2020-07-08 DIAGNOSIS — R928 Other abnormal and inconclusive findings on diagnostic imaging of breast: Secondary | ICD-10-CM

## 2020-07-08 LAB — HM MAMMOGRAPHY

## 2020-10-09 ENCOUNTER — Other Ambulatory Visit: Payer: Self-pay | Admitting: Family Medicine

## 2020-10-22 ENCOUNTER — Encounter: Payer: Self-pay | Admitting: Family Medicine

## 2020-10-22 ENCOUNTER — Ambulatory Visit (INDEPENDENT_AMBULATORY_CARE_PROVIDER_SITE_OTHER): Payer: BLUE CROSS/BLUE SHIELD | Admitting: Family Medicine

## 2020-10-22 VITALS — Temp 98.1°F

## 2020-10-22 DIAGNOSIS — M542 Cervicalgia: Secondary | ICD-10-CM

## 2020-10-22 DIAGNOSIS — R202 Paresthesia of skin: Secondary | ICD-10-CM | POA: Diagnosis not present

## 2020-10-22 NOTE — Patient Instructions (Signed)
Seek inperson evaluation as we discussed.   I hope you are feeling better soon!  It was nice to meet you today. I help  out with telemedicine visits on Tuesdays and Thursdays and am available for visits on those days. If you have any concerns or questions following this visit please schedule a follow up visit with your Primary Care doctor or seek care at a local urgent care clinic to avoid delays in care.

## 2020-10-22 NOTE — Progress Notes (Signed)
Virtual Visit via Video Note  I connected with Amber Hendricks  on 10/22/20 at  5:20 PM EDT by a video enabled telemedicine application and verified that I am speaking with the correct person using two identifiers.  Location patient: home, [place] Location provider:work or home office Persons participating in the virtual visit: patient, provider  I discussed the limitations of evaluation and management by telemedicine and the availability of in person appointments. The patient expressed understanding and agreed to proceed.   HPI:  Acute telemedicine visit for neck pain: -Onset: about 2 weeks ago -Symptoms include: neck pain that "moves around" and some pain in the back of the head - reports has had this in the past with stress, she restarted her buspar that she takes as needed for stress and it felt a bit better -however, she now also has some tingling into the R arm at times  -she saw a massage therapist but it did not help -she now has a "bump at the base of the skull" that really hurts and also has L side neck pain that is interfering with good sleep - reports it is a swollen bump a little smaller than a golf ball -she cant remember any inciting event -carries a lot of stuff and thinks this might have contributed and did some exercises with her kid -is leaving on vacation next week and wants to get this checked out but her PCP office did not have openings -Denies: HA o/w, fever, weakness, vision changes, malaise -Has tried:massage, yoga, advil, tylenol, heat -Pertinent past medical history: see below -Pertinent medication allergies: No Known Allergies   ROS: See pertinent positives and negatives per HPI.  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 (Patient taking differently: Take 5 mg by mouth daily.), Disp: 60 tablet, Rfl: 1   JUNEL FE 1/20 1-20 MG-MCG tablet, Take 1 tablet by mouth daily., Disp: , Rfl: 3   loratadine (CLARITIN) 10 MG tablet, Take 10 mg by mouth daily as needed., Disp: , Rfl:    montelukast (SINGULAIR) 10 MG tablet, TAKE 1 TABLET BY MOUTH EVERYDAY AT BEDTIME, Disp: 30 tablet, Rfl: 5  EXAM:  VITALS per patient if applicable:  GENERAL: alert, oriented, appears well and in no acute distress  HEENT: atraumatic, conjunttiva clear, no obvious abnormalities on inspection of external nose and ears  NECK/MS: she points to the base of her skull as the area where she feels a tender lump - I can not see this area well over a video visit due to hair, she reports decreased ROM when turns her head L to R and reports pain with these movements  LUNGS: on inspection no signs of respiratory  distress, breathing rate appears normal, no obvious gross SOB, gasping or wheezing  CV: no obvious cyanosis  MS: moves all visible extremities without noticeable abnormality  PSYCH/NEURO: pleasant and cooperative, no obvious depression or anxiety, speech and thought processing grossly intact  ASSESSMENT AND PLAN:  Discussed the following assessment and plan:  Neck pain  Paresthesia  -we discussed possible serious and likely etiologies, options for evaluation and workup, limitations of telemedicine visit vs in person visit, treatment, treatment risks and precautions. Pt prefers to treat via telemedicine empirically rather than in  person at this moment. However, given the reports paresthesias and reported swelling or lump have advised prompt inperson evaluation in the next 12 -24 hours. She was not able to get any appointments with her PCP office and does not wish to go to a regular urgent care. We discussed possible sports med referral vs ortho urgent care and she prefers to go to ortho urgent care after discussion of options. Advised to seek prompt in person care right away if worsening, new symptoms arise, or if is not improving with treatment. Discussed options for inperson care if PCP office not available. Did let this patient know that I only do telemedicine on Tuesdays and Thursdays for [place]. Advised to schedule follow up visit with PCP or [facility] if any further questions or concerns to avoid delays in care.   I discussed the assessment and treatment plan with the patient. The patient was provided an opportunity to ask questions and all were answered. The patient agreed with the plan and demonstrated an understanding of the instructions.     Terressa Koyanagi, DO

## 2020-10-23 ENCOUNTER — Other Ambulatory Visit: Payer: Self-pay | Admitting: Medical

## 2020-10-23 ENCOUNTER — Other Ambulatory Visit: Payer: Self-pay | Admitting: Sports Medicine

## 2020-10-23 DIAGNOSIS — M542 Cervicalgia: Secondary | ICD-10-CM

## 2020-10-24 ENCOUNTER — Ambulatory Visit
Admission: RE | Admit: 2020-10-24 | Discharge: 2020-10-24 | Disposition: A | Discharge status: Home / Self Care | Payer: BLUE CROSS/BLUE SHIELD | Source: Ambulatory Visit | Attending: Medical | Admitting: Medical

## 2020-10-24 ENCOUNTER — Other Ambulatory Visit: Payer: Self-pay

## 2020-10-24 DIAGNOSIS — M542 Cervicalgia: Secondary | ICD-10-CM

## 2021-01-08 LAB — HM PAP SMEAR

## 2021-02-06 ENCOUNTER — Encounter: Payer: BLUE CROSS/BLUE SHIELD | Admitting: Physician Assistant

## 2021-02-10 ENCOUNTER — Encounter: Payer: Self-pay | Admitting: Family Medicine

## 2021-02-10 ENCOUNTER — Telehealth (INDEPENDENT_AMBULATORY_CARE_PROVIDER_SITE_OTHER): Payer: BLUE CROSS/BLUE SHIELD | Admitting: Family Medicine

## 2021-02-10 VITALS — Temp 99.4°F

## 2021-02-10 DIAGNOSIS — R051 Acute cough: Secondary | ICD-10-CM | POA: Diagnosis not present

## 2021-02-10 DIAGNOSIS — J101 Influenza due to other identified influenza virus with other respiratory manifestations: Secondary | ICD-10-CM | POA: Diagnosis not present

## 2021-02-10 MED ORDER — DOXYCYCLINE HYCLATE 100 MG PO TABS: 100.0000 mg | ORAL_TABLET | Freq: Two times a day (BID) | ORAL | 0 refills | Status: DC

## 2021-02-10 NOTE — Progress Notes (Signed)
Wyllow Seigler T. Sheryl Towell, MD Primary Care and Sports Medicine [facility] at [facility] [address] [place], [address] Phone: [phone]  FAX: [phone]  Tanaisha Bert Givans - 45 y.o. female  MRN [id number]  Date of Birth: Jan 21, 1976  Visit Date: 02/10/2021  PCP: Pcp, No  Referred by: No ref. provider found  Virtual Visit via Video Note:  I connected with  Tayleigh Xylia Scherger on 02/10/2021  2:40 PM EST by a video enabled telemedicine application and verified that I am speaking with the correct person using two identifiers.   Location patient: home computer, tablet, or smartphone Location provider: work or home office Consent: Verbal consent directly obtained from [employer]. Persons participating in the virtual visit: patient, provider  I discussed the limitations of evaluation and management by telemedicine and the availability of in person appointments. The patient expressed understanding and agreed to proceed.  Chief Complaint  Patient presents with   Cough    Flu has ran through household   Nasal Congestion         History of Present Illness:  Is a very nice young lady, and she has essentially no medical problems.  Multiple family members have had influenza, and her daughter had an office visit with positive influenza A test.  Right now she feels very terrible, she has a lot of congestion, and a severe cough that is worsened in the last couple of days.  She did have some nausea, chills and she had a fever.  At this point her fever is only 99.4 today. She is eating okay, she is drinking and making urine like she normally would.  Terrible, remainder of flu.  Chest cough.   N/chills, fever. Eating ok Drinking   99.4 no meds.   Daughter with confirmed Flu+.  Review of Systems as above: See pertinent positives and pertinent negatives per HPI No acute distress verbally   Observations/Objective/Exam:  An attempt was made to discern  vital signs over the phone and per patient if applicable and possible.   General:    Alert, Oriented, appears well and in no acute distress  Pulmonary:     On inspection no signs of respiratory distress.  Psych / Neurological:     Pleasant and cooperative.  Assessment and Plan:    ICD-10-CM   1. Acute cough  R05.1     2. Influenza A  J10.1      This may all be resolving influenza.  Nevertheless, her cough is worsening in the last day or 2.  Recommended that she continue with some supportive care, but if her symptoms are not resolving in 1 to 2 days or she gets worse then taking some antibiotics secondary to concern for possible secondary pneumonia would be reasonable and appropriate at this point.  I discussed the assessment and treatment plan with the patient. The patient was provided an opportunity to ask questions and all were answered. The patient agreed with the plan and demonstrated an understanding of the instructions.   The patient was advised to call back or seek an in-person evaluation if the symptoms worsen or if the condition fails to improve as anticipated.  Follow-up: prn unless noted otherwise below No follow-ups on file.  Meds ordered this encounter  Medications   doxycycline (VIBRA-TABS) 100 MG tablet    Sig: Take 1 tablet (100 mg total) by mouth 2 (two) times daily.    Dispense:  20 tablet  Refill:  0   No orders of the defined types were placed in this encounter.   Signed,  Elpidio Galea. Thetis Schwimmer, MD

## 2021-04-14 ENCOUNTER — Other Ambulatory Visit: Payer: Self-pay

## 2021-04-14 ENCOUNTER — Encounter: Payer: Self-pay | Admitting: Physician Assistant

## 2021-04-14 ENCOUNTER — Ambulatory Visit (INDEPENDENT_AMBULATORY_CARE_PROVIDER_SITE_OTHER): Payer: BLUE CROSS/BLUE SHIELD | Admitting: Physician Assistant

## 2021-04-14 VITALS — BP 104/70 | HR 66 | Temp 97.5°F | Ht 65.5 in | Wt 137.2 lb

## 2021-04-14 DIAGNOSIS — R519 Headache, unspecified: Secondary | ICD-10-CM

## 2021-04-14 DIAGNOSIS — F418 Other specified anxiety disorders: Secondary | ICD-10-CM

## 2021-04-14 MED ORDER — CITALOPRAM HYDROBROMIDE 10 MG PO TABS: 10.0000 mg | ORAL_TABLET | Freq: Every day | ORAL | 1 refills | Status: DC

## 2021-04-14 NOTE — Progress Notes (Signed)
Amber Hendricks is a 46 y.o. female here for a follow up referred by former PCP, Dr. Artis FlockWolfe.    History of Present Illness:   Chief Complaint  Patient presents with   Transfer of care   Anxiety    HPI  Anxiety Amber Hendricks has previously trialed buspar 5 mg, xanax 0.5 mg, Lexapro 5 mg, Klonopin 0.5 mg , and Ambien 5 mg, but all have not provided pt with adequate relief. Amber Hendricks does admit that she was non-compliant with these medications due to family belief that if you don't take anything you live longer. Although this ideology is engrained into her, she expresses she wants to take something to make her feel better.   At this time, she finds most of her anxiety to be caused by the well being of her children and if she is being a good mother. Currently she is taking ashwagandha 500 mg daily due to it being more natural, but is still interested in trialing something that has few SE. Denies SI/HI. She reports that she just started taking this supplement and is unable to tell if it is helpful.  Migraines Amber Hendricks reports migraines occur monthly, at most three times a month due to menstrual cycle. She is normally able to manage this by using a cooling peppermint stick device, but due to chronic neck pain she has to take ibuprofen 800 mg for relief. Amber Hendricks admits that if she doesn't take ibuprofen 800 mg right when she feels the beginning of a migraine, the pain can last for 48 hours at most. She does not have auras with her migraines. Denies vision changes or dizziness.   Chronic Neck Pain  Pt has been experiencing this pain since late July or early August of 2022. Initially she thought the pain was caused by stress or just simply sleeping wrong, but upon feeling tingling in her right arm, she decided to have this evaluated. Denied fever, weakness, vision changes, or malaise. Following this, she was recommended to complete PT for additional relief.  Currently pt has completed PT, but is still experiencing some pain as  well as increasingly worse migraines. Despite this she is not interested in any medication intervention.   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  Vaping Use   Vaping Use: Never used  Substance Use Topics   Alcohol use: No    Alcohol/week: 0.0 standard drinks    Comment: 7 glasses of wine per week   Drug use: No    Past Surgical History:  Procedure Laterality Date   WISDOM TOOTH EXTRACTION      Family History  Problem Relation Age of Onset   Osteoarthritis Father        (neck and shoulders)   Heart attack Father    Hypertension Father  Osteoporosis Mother    Hyperlipidemia Mother    Heart disease Paternal Grandfather    Dementia Paternal Grandfather    Early death Paternal Grandfather    Heart attack Paternal Grandfather    Dementia Paternal Grandmother    Hearing loss Paternal Grandmother    Hearing loss Maternal Grandmother    Hypertension Maternal Grandmother    Diabetes Maternal Grandfather    Heart disease Maternal Grandfather    Colon cancer Maternal Uncle     No Known Allergies  Current Medications:   Current Outpatient Medications:    acetaminophen (TYLENOL) 500 MG tablet, Take 500 mg by mouth every 6 (six) hours as needed for headache., Disp: , Rfl:    Ashwagandha 500 MG CAPS, Take 1 capsule by mouth daily in the afternoon., Disp: , Rfl:    citalopram (CELEXA) 10 MG tablet, Take 1 tablet (10 mg total) by mouth daily., Disp: 30 tablet, Rfl:  1   ibuprofen (ADVIL) 800 MG tablet, Take 800 mg by mouth every 8 (eight) hours as needed., Disp: , Rfl:    JUNEL FE 1/20 1-20 MG-MCG tablet, Take 1 tablet by mouth daily., Disp: , Rfl: 3   loratadine (CLARITIN) 10 MG tablet, Take 10 mg by mouth daily as needed., Disp: , Rfl:    montelukast (SINGULAIR) 10 MG tablet, TAKE 1 TABLET BY MOUTH EVERYDAY AT BEDTIME, Disp: 30 tablet, Rfl: 5   Omega-3 Fatty Acids (FISH OIL) 1000 MG CAPS, Take 1 capsule by mouth daily in the afternoon., Disp: , Rfl:    Turmeric 500 MG CAPS, Take 1 capsule by mouth daily in the afternoon., Disp: , Rfl:    Review of Systems:   ROS Negative unless otherwise specified per HPI. Vitals:   Vitals:   04/14/21 1002  BP: 104/70  Pulse: 66  Temp: (!) 97.5 F (36.4 C)  TempSrc: Temporal  SpO2: 97%  Weight: 137 lb 4 oz (62.3 kg)  Height: 5' 5.5" (1.664 m)     Body mass index is 22.49 kg/m.  Physical Exam:   Physical Exam Vitals and nursing note reviewed.  Constitutional:      General: She is not in acute distress.    Appearance: She is well-developed. She is not ill-appearing or toxic-appearing.  Cardiovascular:     Rate and Rhythm: Normal rate and regular rhythm.     Pulses: Normal pulses.     Heart sounds: Normal heart sounds, S1 normal and S2 normal.  Pulmonary:     Effort: Pulmonary effort is normal.     Breath sounds: Normal breath sounds.  Musculoskeletal:     Cervical back: Full passive range of motion without pain. No spinous process tenderness.  Skin:    General: Skin is warm and dry.  Neurological:     Mental Status: She is alert.     GCS: GCS eye subscore is 4. GCS verbal subscore is 5. GCS motor subscore is 6.  Psychiatric:        Speech: Speech normal.        Behavior: Behavior normal. Behavior is cooperative.    Assessment and Plan:   Situational anxiety Uncontrolled No red flags on exam; patient denies SI/HI Start Celexa 10 mg daily  I advised patient participate in talk  therapy--provided recommendations  Informed patient that if they develop any SI, to stop medication, tell someone immediately, and seek medical attention Follow up with me in 1 month after starting medication, sooner if concerns  Nonintractable headache, unspecified chronicity pattern, unspecified  headache type; Neck Pain Well-controlled with medication regimen No red flags on exam Continue Ibuprofen 800 mg as needed Follow up if symptoms worsen or concerns occur  I,Havlyn C Ratchford,acting as a scribe for [employer], PA.,have documented all relevant documentation on the behalf of Inda Coke, PA,as directed by  Inda Coke, PA while in the presence of Inda Coke, [place].  I, Inda Coke, [place], have reviewed all documentation for this visit. The documentation on 04/14/21 for the exam, diagnosis, procedures, and orders are all accurate and complete.  Inda Coke, PA-C

## 2021-04-14 NOTE — Patient Instructions (Signed)
It was great to see you!  Trial Celexa 10 mg daily -- please follow-up with me in about 1 month after starting this.  PRIORITIZE YOURSELF! Schedule your exercise as if it is an APPOINTMENT!  Please consider talk therapy Maxcine Ham [email or web address]  Follow-up for your physical whenever you would like.  Take care,  Jarold Motto PA-C

## 2021-04-18 ENCOUNTER — Telehealth: Payer: Self-pay | Admitting: Physician Assistant

## 2021-04-18 ENCOUNTER — Encounter: Payer: Self-pay | Admitting: Physician Assistant

## 2021-04-18 ENCOUNTER — Telehealth (INDEPENDENT_AMBULATORY_CARE_PROVIDER_SITE_OTHER): Payer: BLUE CROSS/BLUE SHIELD | Admitting: Physician Assistant

## 2021-04-18 VITALS — Ht 65.5 in | Wt 137.2 lb

## 2021-04-18 DIAGNOSIS — T43225A Adverse effect of selective serotonin reuptake inhibitors, initial encounter: Secondary | ICD-10-CM

## 2021-04-18 DIAGNOSIS — F418 Other specified anxiety disorders: Secondary | ICD-10-CM

## 2021-04-18 DIAGNOSIS — T50905A Adverse effect of unspecified drugs, medicaments and biological substances, initial encounter: Secondary | ICD-10-CM | POA: Diagnosis not present

## 2021-04-18 DIAGNOSIS — R202 Paresthesia of skin: Secondary | ICD-10-CM | POA: Diagnosis not present

## 2021-04-18 NOTE — Telephone Encounter (Signed)
FYI, Triage note. ?

## 2021-04-18 NOTE — Telephone Encounter (Signed)
Pt called back virtual visit done now with patient.

## 2021-04-18 NOTE — Progress Notes (Signed)
TELEPHONE ENCOUNTER   Patient verbally agreed to telephone visit and is aware that copayment and coinsurance may apply. Patient was treated using telemedicine according to accepted telemedicine protocols.  Location of the patient: home Location of provider: [facility] Names of all persons participating in the telemedicine service and role in the encounter: Jarold Motto, PA , Vevelyn Francois  Subjective:   Chief Complaint  Patient presents with   Medication Reaction     HPI   Medication Reaction; Situational Anxiety Pt was started on Celexa 1/24. She has been taking this daily. She states that she has noticed after taking it she has had numbness and tingling down both of her arms.  Symptoms last for a few minutes, will stop if she stays still, and then will return if she moves. She is unable to tell if symptoms are worsening or getting better. She does have hx of neck pain that has caused this symptom in the past but was unilateral.  Denies: CP, SOB, weakness, HA, double/blurred vision, slurred speech  She does feel like the medication is already helping her anxiety a bit.  Patient Active Problem List   Diagnosis Date Noted   Situational anxiety 01/11/2020   Headache 05/26/2013   Social History   Tobacco Use   Smoking status: Former    Types: Cigarettes    Quit date: 09/12/2009    Years since quitting: 11.6   Smokeless tobacco: Never   Tobacco comments:    Quit five years ago.  Substance Use Topics   Alcohol use: No    Alcohol/week: 0.0 standard drinks    Comment: 7 glasses of wine per week    Current Outpatient Medications:    acetaminophen (TYLENOL) 500 MG tablet, Take 500 mg by mouth every 6 (six) hours as needed for headache., Disp: , Rfl:    citalopram (CELEXA) 10 MG tablet, Take 1 tablet (10 mg total) by mouth daily., Disp: 30 tablet, Rfl: 1   ibuprofen (ADVIL) 800 MG tablet, Take 800 mg by mouth every 8 (eight) hours as needed., Disp: , Rfl:    JUNEL FE 1/20  1-20 MG-MCG tablet, Take 1 tablet by mouth daily., Disp: , Rfl: 3   loratadine (CLARITIN) 10 MG tablet, Take 10 mg by mouth daily as needed., Disp: , Rfl:    montelukast (SINGULAIR) 10 MG tablet, TAKE 1 TABLET BY MOUTH EVERYDAY AT BEDTIME, Disp: 30 tablet, Rfl: 5   Omega-3 Fatty Acids (FISH OIL) 1000 MG CAPS, Take 1 capsule by mouth daily in the afternoon., Disp: , Rfl:    Turmeric 500 MG CAPS, Take 1 capsule by mouth daily in the afternoon., Disp: , Rfl:  No Known Allergies  Assessment & Plan:   1. Adverse effect of drug, initial encounter  No red flags on discussion Suspect this is related to her SSRI and possibly her known neck issues Recommend that she stay the course and continue medication If worsening/new symptoms, she was instructed to stop medication and call our office  She is also going to trial her neck exercises as well   2. Situational anxiety  Slightly improved Recommend continue to stay the course with celexa 10 mg daily, see above Continue to closely monitor No red flags/SI    No orders of the defined types were placed in this encounter.  No orders of the defined types were placed in this encounter.   Jarold Motto, [place] 04/18/2021  Time spent with the patient: 8 minutes, spent in obtaining information about  her symptoms, reviewing her previous labs, evaluations, and treatments, counseling her about her condition (please see the discussed topics above), and developing a plan to further investigate it; she had a number of questions which I addressed.

## 2021-04-18 NOTE — Telephone Encounter (Signed)
Tried to contact pt no answer, no voicemail to leave message. Will try again later.

## 2021-04-18 NOTE — Telephone Encounter (Signed)
Pt completed a Mychart video visit with Bufford Buttner today.  Patient Name: Amber Hendricks E Gender: Female DOB: 11-17-1975 Age: 46 Y 6 M 21 D Return Phone Number: [phone] (Primary) Address: City/ State/ Zip: [place]  [address] Client [facility] - [facility], [facility] at [facility] Provider Bufford Buttner, [place]- PA Contact Type Call Who Is Calling Patient / Member / Family / Caregiver Call Type Triage / Clinical Relationship To Patient Self Return Phone Number [phone] (Primary) Chief Complaint Numbness Reason for Call Symptomatic / Request for Health Information Initial Comment Caller states she has numbness and tingling on both arms when she sleeps not during the day since taking a medication. Translation No Nurse Assessment Nurse: Vear Clock, RN, Elease Hashimoto Date/Time Lamount Cohen Time): 04/18/2021 8:25:32 AM Confirm and document reason for call. If symptomatic, describe symptoms. ---She has just taking Celexa. She noticed she is having numbness when she sleeps in both arms. The numbness goes away once she is up and about. She has not chest pain. She had the numbness previously but it had gone away Does the patient have any new or worsening symptoms? ---Yes Will a triage be completed? ---Yes Related visit to physician within the last 2 weeks? ---No Does the PT have any chronic conditions? (i.e. diabetes, asthma, this includes High risk factors for pregnancy, etc.) ---Yes List chronic conditions. ---anxiety Is the patient pregnant or possibly pregnant? (Ask all females between the ages of 97-55) ---No Is this a behavioral health or substance abuse call? ---No Guidelines Guideline Title Affirmed Question Affirmed Notes Nurse Date/Time (Eastern Time) Neurologic Deficit Patient sounds very sick or weak to the triager Vear Clock, RN, Elease Hashimoto 04/18/2021 8:28:27 AM  Disp. Time Lamount Cohen Time) Disposition Final  User 04/18/2021 8:33:55 AM Go to ED Now (or PCP triage) Yes Vear Clock, RN, Ancil Boozer Disagree/Comply Disagree Caller Understands Yes PreDisposition Call Doctor Care Advice Given Per Guideline GO TO ED NOW (OR PCP TRIAGE): ANOTHER ADULT SHOULD DRIVE: * It is better and safer if another adult drives instead of you. CARE ADVICE given per Neurologic Deficit (Adult) guideline. Referrals GO TO FACILITY REFUSED

## 2021-04-18 NOTE — Telephone Encounter (Signed)
Pt called stating that she is having a reaction to the medication Citalopram (Selexa) which she just started taking a few days ago. Pt states that she is having Numbness and Tingling as a result of starting the medication. I was transferring patient to the triage nurse phone line., but patient ended call while I was on hold. Pt has requested a call from Miss Lupita Leash as soon as possible.      [facility].

## 2021-04-22 ENCOUNTER — Encounter: Payer: Self-pay | Admitting: Physician Assistant

## 2021-04-23 ENCOUNTER — Other Ambulatory Visit: Payer: Self-pay

## 2021-04-23 ENCOUNTER — Encounter: Payer: Self-pay | Admitting: Physician Assistant

## 2021-04-23 ENCOUNTER — Ambulatory Visit (INDEPENDENT_AMBULATORY_CARE_PROVIDER_SITE_OTHER): Payer: BLUE CROSS/BLUE SHIELD | Admitting: Physician Assistant

## 2021-04-23 VITALS — BP 102/70 | HR 95 | Temp 97.6°F | Ht 65.5 in | Wt 131.4 lb

## 2021-04-23 DIAGNOSIS — T50905D Adverse effect of unspecified drugs, medicaments and biological substances, subsequent encounter: Secondary | ICD-10-CM | POA: Diagnosis not present

## 2021-04-23 DIAGNOSIS — F418 Other specified anxiety disorders: Secondary | ICD-10-CM

## 2021-04-23 NOTE — Progress Notes (Signed)
Amber Hendricks is a 46 y.o. female here for medication reaction.  History of Present Illness:   Chief Complaint  Patient presents with   Medication Reaction    HPI   Medication Reaction [name] presents with c/o side effects shortly after beginning celexa 20 mg daily. Pt was initially seen about this via virtual visit on 04/18/21, which was 3 days after she began the medication. During this visit she expressed she noticed she was experiencing numbness and tingling down both her arms, specifically when try to sleep. Stated that sx would last for a few minutes and stop upon staying still, but will return upon regular movement. Despite these sx, she did find that the medication was helping with her anxiety and wanted to continue to see if things would even out.   As of today, pt has remained compliant for the medication for about 9 days and has experienced additional sx including left sided jaw pain, diarrhea, and nausea/vomiting. Currently reports that jaw pain has since resolved with no lingering effects, but her nausea, vomiting, and diarrhea ,which she experienced yesterday, has left her drained. She does still find it hard to want to eat, but believes this is caused by the fact that she was nausea all day yesterday.   At this time, she wanted to be some reassurance and guidance on whether she should continue the medication to see if things even out or if she should trial another medication. She is not sure if she feels any emotional benefit from the Celexa due to being distracted by other sx. Pt does admit that past GI sx could have been a GI bug that she got from a birthday party but isn't sure.   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  Vaping Use   Vaping Use: Never used  Substance Use Topics   Alcohol use: No    Alcohol/week: 0.0 standard drinks    Comment: 7 glasses of wine per week   Drug use: No    Past Surgical History:  Procedure Laterality Date   WISDOM TOOTH EXTRACTION      Family History  Problem Relation Age of Onset   Osteoarthritis Father        (neck and shoulders)   Heart attack Father    Hypertension Father    Osteoporosis Mother    Hyperlipidemia Mother    Heart disease Paternal Grandfather    Dementia Paternal Grandfather    Early death Paternal Grandfather    Heart attack Paternal Grandfather    Dementia Paternal Grandmother    Hearing loss Paternal Grandmother    Hearing loss Maternal Grandmother    Hypertension Maternal Grandmother    Diabetes Maternal Grandfather    Heart disease Maternal Grandfather    Colon cancer Maternal Uncle  No Known Allergies  Current Medications:   Current Outpatient Medications:    acetaminophen (TYLENOL) 500 MG tablet, Take 500 mg by mouth every 6 (six) hours as needed for headache., Disp: , Rfl:    citalopram (CELEXA) 10 MG tablet, Take 1 tablet (10 mg total) by mouth daily., Disp: 30 tablet, Rfl: 1   ibuprofen (ADVIL) 800 MG tablet, Take 800 mg by mouth every 8 (eight) hours as needed., Disp: , Rfl:    JUNEL FE 1/20 1-20 MG-MCG tablet, Take 1 tablet by mouth daily., Disp: , Rfl: 3   loratadine (CLARITIN) 10 MG tablet, Take 10 mg by mouth daily as needed., Disp: , Rfl:    montelukast (SINGULAIR) 10 MG tablet, TAKE 1 TABLET BY MOUTH EVERYDAY AT BEDTIME, Disp: 30 tablet, Rfl: 5   Omega-3 Fatty Acids (FISH OIL) 1000 MG CAPS, Take 1 capsule by mouth daily in the afternoon., Disp: , Rfl:     Turmeric 500 MG CAPS, Take 1 capsule by mouth daily in the afternoon., Disp: , Rfl:    Review of Systems:   ROS Negative unless otherwise specified per HPI. Vitals:   Vitals:   04/23/21 1406  BP: 102/70  Pulse: 95  Temp: 97.6 F (36.4 C)  TempSrc: Temporal  SpO2: 97%  Weight: 131 lb 6.1 oz (59.6 kg)  Height: 5' 5.5" (1.664 m)     Body mass index is 21.53 kg/m.  Physical Exam:   Physical Exam Vitals and nursing note reviewed.  Constitutional:      General: She is not in acute distress.    Appearance: She is well-developed. She is not ill-appearing or toxic-appearing.  Cardiovascular:     Rate and Rhythm: Normal rate and regular rhythm.     Pulses: Normal pulses.     Heart sounds: Normal heart sounds, S1 normal and S2 normal.  Pulmonary:     Effort: Pulmonary effort is normal.     Breath sounds: Normal breath sounds.  Skin:    General: Skin is warm and dry.  Neurological:     Mental Status: She is alert.     GCS: GCS eye subscore is 4. GCS verbal subscore is 5. GCS motor subscore is 6.  Psychiatric:        Speech: Speech normal.        Behavior: Behavior normal. Behavior is cooperative.    Assessment and Plan:   Adverse effect of drug, subsequent encounter  No red flags on exam Encouraged patient to stay the course with the medication and that these side effects should improve with time If worsening/new symptoms, she was instructed to stop medication and/or send mychart message We may consider stopping medication and having her resumed her ashwaghanda  Situational Anxiety Remains uncontrolled Recommended continue to stay the course with celexa 10 mg daily, see above Continue to closely monitor  No red flags/ SI or HI   I,Havlyn C Ratchford,acting as a scribe for [employer], PA.,have documented all relevant documentation on the behalf of Jarold Motto, PA,as directed by  Jarold Motto, PA while in the presence of Jarold Motto, [place].  I, Jarold Motto, [place], have reviewed all documentation for this visit. The documentation on 04/23/21 for the exam, diagnosis, procedures, and orders are all accurate and complete.   Jarold Motto, PA-C

## 2021-04-25 ENCOUNTER — Encounter: Payer: Self-pay | Admitting: Physician Assistant

## 2021-04-29 ENCOUNTER — Encounter: Payer: Self-pay | Admitting: Physician Assistant

## 2021-04-29 ENCOUNTER — Other Ambulatory Visit: Payer: Self-pay | Admitting: Physician Assistant

## 2021-05-07 ENCOUNTER — Other Ambulatory Visit: Payer: Self-pay | Admitting: Physician Assistant

## 2021-05-23 ENCOUNTER — Other Ambulatory Visit: Payer: Self-pay | Admitting: Obstetrics and Gynecology

## 2021-05-23 DIAGNOSIS — Z1231 Encounter for screening mammogram for malignant neoplasm of breast: Secondary | ICD-10-CM

## 2021-07-08 ENCOUNTER — Ambulatory Visit
Admission: RE | Admit: 2021-07-08 | Discharge: 2021-07-08 | Disposition: A | Discharge status: Home / Self Care | Payer: BLUE CROSS/BLUE SHIELD | Source: Ambulatory Visit | Attending: Obstetrics and Gynecology | Admitting: Obstetrics and Gynecology

## 2021-07-08 DIAGNOSIS — Z1231 Encounter for screening mammogram for malignant neoplasm of breast: Secondary | ICD-10-CM

## 2021-07-16 ENCOUNTER — Telehealth: Payer: Self-pay

## 2021-07-16 NOTE — Telephone Encounter (Signed)
Patient called asking when her last tetanus shot was given to her as she had just cut herself with a tomato can. I could not see any record of a tetanus shot in our system. I tried reaching out to our after hours triage and they did not answer. I received a recording instead asking to hold for the next available.  After being on hold for over 10 minutes, I contacted our nurse supervisor and she advised patient should go to urgent care and not wait until the morning. Patient understood and agreed. ?

## 2021-11-19 ENCOUNTER — Encounter: Payer: Self-pay | Admitting: Physician Assistant

## 2021-11-19 ENCOUNTER — Other Ambulatory Visit: Payer: Self-pay | Admitting: Physician Assistant

## 2021-11-19 MED ORDER — ESCITALOPRAM OXALATE 5 MG PO TABS: 5.0000 mg | ORAL_TABLET | Freq: Every day | ORAL | 1 refills | Status: DC

## 2021-12-12 ENCOUNTER — Other Ambulatory Visit: Payer: Self-pay | Admitting: Physician Assistant

## 2021-12-15 ENCOUNTER — Encounter: Payer: Self-pay | Admitting: Physician Assistant

## 2021-12-15 ENCOUNTER — Ambulatory Visit (INDEPENDENT_AMBULATORY_CARE_PROVIDER_SITE_OTHER): Payer: BLUE CROSS/BLUE SHIELD | Admitting: Physician Assistant

## 2021-12-15 VITALS — BP 102/70 | HR 61 | Temp 97.7°F | Ht 65.5 in | Wt 137.0 lb

## 2021-12-15 DIAGNOSIS — J029 Acute pharyngitis, unspecified: Secondary | ICD-10-CM | POA: Diagnosis not present

## 2021-12-15 DIAGNOSIS — F418 Other specified anxiety disorders: Secondary | ICD-10-CM

## 2021-12-15 MED ORDER — AMOXICILLIN 875 MG PO TABS: 875.0000 mg | ORAL_TABLET | Freq: Two times a day (BID) | ORAL | 0 refills | Status: AC

## 2021-12-15 NOTE — Progress Notes (Signed)
Amber Hendricks is a 46 y.o. female here for a follow up of a pre-existing problem.  History of Present Illness:   Chief Complaint  Patient presents with   Otalgia    Pt c/o left ear pain   Laryngitis    Pt c/o laryngitis since last Wed, little better today. Having chest congestion, dry cough non expectorant. Home COVID test Neg on Friday 9/22. Had low grade fever last Mon, Tues & Wed 99.6. none since.    HPI  Laryngitis Felt bad starting a week ago Lost voice Wednesday Kids have been sick since school started Dry cough, chest and nasal congestion Has taken Claritin D, Robutissin DM, Montelukast with some improvement of symptoms Daughter reported sore throat starting yesterday Max temperature has been 99.6 Denies: recent travel Home COVID test was negative  Anxiety She is currently taking Lexapro 5 mg daily Tolerating well without any significant side effects She does like the way that Celexa made her feel in regards to more motivation and happier Denies suicidal or homicidal ideation Taking medicine consistently Does also have issues with insomnia at times  Past Medical History:  Diagnosis Date   Allergy    Anxiety    Chronic tension headaches    /cervicogenic HAs- Dr Krista Blue consults 3/15   Cysts of both ovaries    Former smoker    Frequent headaches    History of chicken pox    History of migraine headaches    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  Vaping Use   Vaping Use: Never used  Substance Use Topics   Alcohol use: No    Alcohol/week: 0.0 standard drinks of alcohol    Comment: 7 glasses of wine per week   Drug use: No    Past Surgical History:  Procedure Laterality Date   WISDOM TOOTH EXTRACTION       Family History  Problem Relation Age of Onset   Osteoarthritis Father        (neck and shoulders)   Heart attack Father    Hypertension Father    Osteoporosis Mother    Hyperlipidemia Mother    Heart disease Paternal Grandfather    Dementia Paternal Grandfather    Early death Paternal Grandfather    Heart attack Paternal Grandfather    Dementia Paternal Grandmother    Hearing loss Paternal Grandmother    Hearing loss Maternal Grandmother    Hypertension Maternal Grandmother    Diabetes Maternal Grandfather    Heart disease Maternal Grandfather    Colon cancer Maternal Uncle     No Known Allergies  Current Medications:   Current Outpatient Medications:    acetaminophen (TYLENOL) 500 MG tablet, Take 500 mg by mouth every 6 (six) hours as needed for headache., Disp: , Rfl:    escitalopram (LEXAPRO) 5 MG tablet, TAKE 1 TABLET (5 MG TOTAL) BY MOUTH DAILY., Disp: 30 tablet, Rfl: 0   ibuprofen (ADVIL) 800 MG tablet, Take 800 mg by mouth every 8 (eight) hours as needed., Disp: , Rfl:    JUNEL FE 1/20 1-20 MG-MCG tablet, Take 1 tablet by mouth daily., Disp: , Rfl: 3  loratadine (CLARITIN) 10 MG tablet, Take 10 mg by mouth daily as needed., Disp: , Rfl:    montelukast (SINGULAIR) 10 MG tablet, TAKE 1 TABLET BY MOUTH EVERYDAY AT BEDTIME, Disp: 30 tablet, Rfl: 5   Omega-3 Fatty Acids (FISH OIL) 1000 MG CAPS, Take 1 capsule by mouth daily in the afternoon., Disp: , Rfl:    Turmeric 500 MG CAPS, Take 1 capsule by mouth daily in the afternoon., Disp: , Rfl:    Review of Systems:   ROS Negative unless otherwise specified per HPI.   Vitals:   Vitals:   12/15/21 1114  BP: 102/70  Pulse: 61  Temp: 97.7 F (36.5 C)  TempSrc: Temporal  SpO2: 98%  Weight: 137 lb (62.1 kg)  Height: 5' 5.5" (1.664 m)     Body mass index is 22.45 kg/m.  Physical Exam:   Physical Exam Vitals and nursing note reviewed.  Constitutional:      General: She is not in acute distress.     Appearance: She is well-developed. She is not ill-appearing or toxic-appearing.  HENT:     Head: Normocephalic and atraumatic.     Right Ear: Tympanic membrane, ear canal and external ear normal. Tympanic membrane is not erythematous, retracted or bulging.     Left Ear: Tympanic membrane, ear canal and external ear normal. Tympanic membrane is not erythematous, retracted or bulging.     Nose: Nose normal.     Right Sinus: No maxillary sinus tenderness or frontal sinus tenderness.     Left Sinus: No maxillary sinus tenderness or frontal sinus tenderness.     Mouth/Throat:     Pharynx: Uvula midline. Posterior oropharyngeal erythema present.     Tonsils: Tonsillar exudate present. 2+ on the right. 2+ on the left.  Eyes:     General: Lids are normal.     Conjunctiva/sclera: Conjunctivae normal.  Neck:     Trachea: Trachea normal.  Cardiovascular:     Rate and Rhythm: Normal rate and regular rhythm.     Heart sounds: Normal heart sounds, S1 normal and S2 normal.  Pulmonary:     Effort: Pulmonary effort is normal.     Breath sounds: Normal breath sounds. No decreased breath sounds, wheezing, rhonchi or rales.  Lymphadenopathy:     Cervical: No cervical adenopathy.  Skin:    General: Skin is warm and dry.  Neurological:     Mental Status: She is alert.  Psychiatric:        Speech: Speech normal.        Behavior: Behavior normal. Behavior is cooperative.     Assessment and Plan:   Sore throat No red flags on exam Suspect strep throat We will treat with amoxicillin 875 twice daily We were going to do a strep test but she has a very sensitive gag reflex Consider ibuprofen for swelling and pain Recommend nasal saline followed by Flonase for suspected eustachian tube dysfunction Follow-up if any new or worsening symptoms   Situational anxiety Overall well controlled Recommend that she consider taking this medication at night to see if doing so can help her sleep improve Will  increase Lexapro to 10 mg daily and I have asked her to let us know if this is working well for her after about a month of starting this Follow-up if in 6 months, sooner if concerns I discussed with patient that if they develop any SI, to tell someone immediately and seek medical attention.     Inda Coke, PA-C

## 2021-12-15 NOTE — Patient Instructions (Addendum)
It was great to see you!  Start amoxicillin antibiotic for supsected strep  May use over the counter antihistamines such as Zyrtec (cetirizine), Claritin (loratadine), Allegra (fexofenadine), or Xyzal (levocetirizine) daily as needed. May take daily. May continue try Singulair 10mg  daily at night.  Start Flonase 1 spray twice a day. Nasal saline spray (i.e., Simply Saline) or nasal saline lavage (i.e., NeilMed) is recommended as needed and prior to medicated nasal sprays.   Play around with the lexapro 5 mg timing (take at night) to see if this helps with sleep Recommend increasing to 10 mg after your illness is gone to see if you can get more out of the medication Follow-up with me via mychart a month after starting this increase to let me know how it is going

## 2021-12-16 ENCOUNTER — Ambulatory Visit: Payer: BLUE CROSS/BLUE SHIELD | Admitting: Physician Assistant

## 2022-01-06 ENCOUNTER — Other Ambulatory Visit: Payer: Self-pay | Admitting: Physician Assistant

## 2022-01-06 ENCOUNTER — Encounter: Payer: Self-pay | Admitting: Physician Assistant

## 2022-01-06 ENCOUNTER — Ambulatory Visit (INDEPENDENT_AMBULATORY_CARE_PROVIDER_SITE_OTHER): Payer: BLUE CROSS/BLUE SHIELD | Admitting: Physician Assistant

## 2022-01-06 VITALS — BP 100/60 | HR 77 | Temp 97.3°F | Ht 65.5 in | Wt 135.5 lb

## 2022-01-06 DIAGNOSIS — J029 Acute pharyngitis, unspecified: Secondary | ICD-10-CM | POA: Diagnosis not present

## 2022-01-06 MED ORDER — AMOXICILLIN-POT CLAVULANATE 875-125 MG PO TABS: 1.0000 | ORAL_TABLET | Freq: Two times a day (BID) | ORAL | 0 refills | Status: DC

## 2022-01-06 MED ORDER — MONTELUKAST SODIUM 10 MG PO TABS: ORAL_TABLET | ORAL | 1 refills | Status: DC

## 2022-01-06 NOTE — Patient Instructions (Addendum)
It was great to see you!  Oral augmentin for tonsillitis and ear infection Continue dual action tylenol/ibuprofen Start flonase twice daily - 1 squirt twice daily  Restart nightly singulair and daily antihistamine of choice at least seasonally  Follow-up if any concerns  Take care,  Inda Coke PA-C

## 2022-01-06 NOTE — Progress Notes (Signed)
Amber Hendricks is a 46 y.o. female here for a follow up of a pre-existing problem.  History of Present Illness:   Chief Complaint  Patient presents with   Otalgia    Pt c/o left ear pain started yesterday morning, Pt went to Urgent care yesterday prescribed antibiotic but could not pick up due to pharmacy closed. Prior to ear hurting pt had vertigo off and on for 2 days.   Sore Throat    Strep test negative done at Urgent care.    HPI  Left Ear Pain She complains of pain near her left ear, which causes her headaches. She complains of sore throat and notes she has not been eating since ear hurts when she swallows. She also complains of mild vertigo.  She went to the Urgent care due to her ear pain, where she was prescribed antibiotic for an ear infection. Her strep test was negative at the urgent care. She is taking Advil dual action and Tylenol. She states she usually takes allergy medication every fall, which she has not taken this time. She has not been taking Singulair 10 mg.   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  Vaping Use   Vaping Use: Never used  Substance Use Topics   Alcohol use: No    Alcohol/week: 0.0 standard drinks of alcohol    Comment: 7 glasses of wine per week   Drug use: No    Past Surgical History:  Procedure Laterality Date   WISDOM TOOTH EXTRACTION      Family History  Problem Relation Age of Onset   Osteoarthritis Father        (neck and  shoulders)   Heart attack Father    Hypertension Father    Osteoporosis Mother    Hyperlipidemia Mother    Heart disease Paternal Grandfather    Dementia Paternal Grandfather    Early death Paternal Grandfather    Heart attack Paternal Grandfather    Dementia Paternal Grandmother    Hearing loss Paternal Grandmother    Hearing loss Maternal Grandmother    Hypertension Maternal Grandmother    Diabetes Maternal Grandfather    Heart disease Maternal Grandfather    Colon cancer Maternal Uncle     No Known Allergies  Current Medications:   Current Outpatient Medications:    acetaminophen (TYLENOL) 500 MG tablet, Take 500 mg by mouth every 6 (six) hours as needed for headache., Disp: , Rfl:    amoxicillin-clavulanate (AUGMENTIN) 875-125 MG tablet, Take 1 tablet by mouth 2 (two) times daily., Disp: 14 tablet, Rfl: 0   escitalopram (LEXAPRO) 5 MG tablet, TAKE 1 TABLET (5 MG TOTAL) BY MOUTH DAILY., Disp: 30 tablet, Rfl: 0   ibuprofen (ADVIL) 800 MG tablet, Take 800 mg by mouth every 8 (eight) hours  as needed., Disp: , Rfl:    JUNEL FE 1/20 1-20 MG-MCG tablet, Take 1 tablet by mouth daily., Disp: , Rfl: 3   loratadine (CLARITIN) 10 MG tablet, Take 10 mg by mouth daily as needed., Disp: , Rfl:    Omega-3 Fatty Acids (FISH OIL) 1000 MG CAPS, Take 1 capsule by mouth daily in the afternoon., Disp: , Rfl:    Turmeric 500 MG CAPS, Take 1 capsule by mouth daily in the afternoon., Disp: , Rfl:    montelukast (SINGULAIR) 10 MG tablet, TAKE 1 TABLET BY MOUTH EVERYDAY AT BEDTIME, Disp: 90 tablet, Rfl: 1   Review of Systems:   Review of Systems  HENT:  Positive for ear pain ((+) Left ear pain) and sore throat.     Vitals:   Vitals:   01/06/22 0936  BP: 100/60  Pulse: 77  Temp: (!) 97.3 F (36.3 C)  TempSrc: Temporal  SpO2: 98%  Weight: 135 lb 8 oz (61.5 kg)  Height: 5' 5.5" (1.664 m)     Body mass index is 22.21 kg/m.  Physical Exam:   Physical Exam Constitutional:      General:  She is not in acute distress.    Appearance: Normal appearance. She is not ill-appearing.  HENT:     Head: Normocephalic and atraumatic.     Right Ear: External ear normal.     Left Ear: External ear normal.     Mouth/Throat:     Tonsils: Tonsillar exudate present. 2+ on the right. 1+ on the left.  Eyes:     Extraocular Movements: Extraocular movements intact.     Pupils: Pupils are equal, round, and reactive to light.  Cardiovascular:     Rate and Rhythm: Normal rate and regular rhythm.     Pulses: Normal pulses.     Heart sounds: Normal heart sounds. No murmur heard.    No gallop.  Pulmonary:     Effort: Pulmonary effort is normal. No respiratory distress.     Breath sounds: Normal breath sounds. No wheezing or rales.  Lymphadenopathy:     Cervical: Cervical adenopathy present.     Right cervical: Superficial cervical adenopathy present.     Left cervical: Superficial cervical adenopathy present.  Skin:    General: Skin is warm and dry.  Neurological:     Mental Status: She is alert and oriented to person, place, and time.  Psychiatric:        Judgment: Judgment normal.     Assessment and Plan:   Sore throat Concern for ETD dysfunction and L tonsillitis Start flonase, zyrtec and singulair Will also rx augmentin for tonsillitis If new/worsening sx, recommend close follow-up   I,Param Shah,acting as a scribe for [employer], PA.,have documented all relevant documentation on the behalf of Jarold Motto, PA,as directed by  Jarold Motto, PA while in the presence of Jarold Motto, [place].  I, Jarold Motto, [place], have reviewed all documentation for this visit. The documentation on 01/06/22 for the exam, diagnosis, procedures, and orders are all accurate and complete.  Jarold Motto, PA-C

## 2022-01-07 ENCOUNTER — Other Ambulatory Visit: Payer: Self-pay | Admitting: Physician Assistant

## 2022-01-07 ENCOUNTER — Encounter: Payer: Self-pay | Admitting: Physician Assistant

## 2022-01-07 MED ORDER — PREDNISONE 20 MG PO TABS: 40.0000 mg | ORAL_TABLET | Freq: Every day | ORAL | 0 refills | Status: DC

## 2022-01-11 ENCOUNTER — Encounter (HOSPITAL_BASED_OUTPATIENT_CLINIC_OR_DEPARTMENT_OTHER): Payer: Self-pay | Admitting: Emergency Medicine

## 2022-01-11 ENCOUNTER — Emergency Department (HOSPITAL_BASED_OUTPATIENT_CLINIC_OR_DEPARTMENT_OTHER): Payer: BLUE CROSS/BLUE SHIELD

## 2022-01-11 ENCOUNTER — Emergency Department (HOSPITAL_BASED_OUTPATIENT_CLINIC_OR_DEPARTMENT_OTHER)
Admission: EM | Admit: 2022-01-11 | Discharge: 2022-01-11 | Disposition: A | Discharge status: Home / Self Care | Payer: BLUE CROSS/BLUE SHIELD | Attending: Emergency Medicine | Admitting: Emergency Medicine

## 2022-01-11 ENCOUNTER — Encounter: Payer: Self-pay | Admitting: Physician Assistant

## 2022-01-11 ENCOUNTER — Other Ambulatory Visit: Payer: Self-pay

## 2022-01-11 DIAGNOSIS — D72829 Elevated white blood cell count, unspecified: Secondary | ICD-10-CM | POA: Insufficient documentation

## 2022-01-11 DIAGNOSIS — J029 Acute pharyngitis, unspecified: Secondary | ICD-10-CM

## 2022-01-11 DIAGNOSIS — Z1152 Encounter for screening for COVID-19: Secondary | ICD-10-CM | POA: Diagnosis not present

## 2022-01-11 DIAGNOSIS — J36 Peritonsillar abscess: Secondary | ICD-10-CM | POA: Diagnosis not present

## 2022-01-11 LAB — CBC WITH DIFFERENTIAL/PLATELET
Abs Immature Granulocytes: 0.06 10*3/uL (ref 0.00–0.07)
Basophils Absolute: 0 10*3/uL (ref 0.0–0.1)
Basophils Relative: 0 %
Eosinophils Absolute: 0 10*3/uL (ref 0.0–0.5)
Eosinophils Relative: 0 %
HCT: 38.5 % (ref 36.0–46.0)
Hemoglobin: 13 g/dL (ref 12.0–15.0)
Immature Granulocytes: 0 %
Lymphocytes Relative: 5 %
Lymphs Abs: 0.7 10*3/uL (ref 0.7–4.0)
MCH: 30.7 pg (ref 26.0–34.0)
MCHC: 33.8 g/dL (ref 30.0–36.0)
MCV: 91 fL (ref 80.0–100.0)
Monocytes Absolute: 0.6 10*3/uL (ref 0.1–1.0)
Monocytes Relative: 4 %
Neutro Abs: 13.9 10*3/uL — ABNORMAL HIGH (ref 1.7–7.7)
Neutrophils Relative %: 91 %
Platelets: 310 10*3/uL (ref 150–400)
RBC: 4.23 MIL/uL (ref 3.87–5.11)
RDW: 12.5 % (ref 11.5–15.5)
WBC: 15.3 10*3/uL — ABNORMAL HIGH (ref 4.0–10.5)
nRBC: 0 % (ref 0.0–0.2)

## 2022-01-11 LAB — COMPREHENSIVE METABOLIC PANEL
ALT: 79 U/L — ABNORMAL HIGH (ref 0–44)
AST: 37 U/L (ref 15–41)
Albumin: 4.4 g/dL (ref 3.5–5.0)
Alkaline Phosphatase: 113 U/L (ref 38–126)
Anion gap: 12 (ref 5–15)
BUN: 12 mg/dL (ref 6–20)
CO2: 26 mmol/L (ref 22–32)
Calcium: 10 mg/dL (ref 8.9–10.3)
Chloride: 99 mmol/L (ref 98–111)
Creatinine, Ser: 0.67 mg/dL (ref 0.44–1.00)
GFR, Estimated: >60 mL/min (ref >60)
Glucose, Bld: 141 mg/dL — ABNORMAL HIGH (ref 70–99)
Potassium: 3.5 mmol/L (ref 3.5–5.1)
Sodium: 137 mmol/L (ref 135–145)
Total Bilirubin: 0.6 mg/dL (ref 0.3–1.2)
Total Protein: 6.9 g/dL (ref 6.5–8.1)

## 2022-01-11 LAB — RESP PANEL BY RT-PCR (FLU A&B, COVID) ARPGX2
Influenza A by PCR: NEGATIVE
Influenza B by PCR: NEGATIVE
SARS Coronavirus 2 by RT PCR: NEGATIVE

## 2022-01-11 LAB — PREGNANCY, URINE: Preg Test, Ur: NEGATIVE

## 2022-01-11 LAB — GROUP A STREP BY PCR: Group A Strep by PCR: NOT DETECTED

## 2022-01-11 LAB — LACTIC ACID, PLASMA: Lactic Acid, Venous: 0.9 mmol/L (ref 0.5–1.9)

## 2022-01-11 MED ORDER — KETOROLAC TROMETHAMINE 15 MG/ML IJ SOLN
15.0000 mg | Freq: Once | INTRAMUSCULAR | Status: AC
  Administered 2022-01-11: 15 mg via INTRAVENOUS
  Filled 2022-01-11: qty 1

## 2022-01-11 MED ORDER — AMOXICILLIN-POT CLAVULANATE 875-125 MG PO TABS: 1.0000 | ORAL_TABLET | Freq: Two times a day (BID) | ORAL | 0 refills | Status: DC

## 2022-01-11 MED ORDER — IOHEXOL 300 MG/ML  SOLN
100.0000 mL | Freq: Once | INTRAMUSCULAR | Status: AC | PRN
  Administered 2022-01-11: 75 mL via INTRAVENOUS

## 2022-01-11 NOTE — Discharge Instructions (Addendum)
Your evaluated in the ED today for sore throat.  Imaging today indicated a peritonsillar abscess, further information regarding this diagnosis has been provided for you to review.  This area was drained pretty extensively, however continue to utilize warm salt water rinses 5-6 times per day for the next few days.  This condition usually requires a 10 to 14-day antibiotic prescription for Augmentin, you currently have a 7-day prescription.  A 7-day extension has been sent to your pharmacy, continue as prescribed.    Continue your prednisone as well until it is finished.  You may also utilize anti-inflammatories and Tylenol at home for additional pain relief.  Keep in mind the maximum dose of Tylenol is 1000 mg every 6 hours (4000 mg/day).  You been provided the contact information for a local ENT specialist by the name of Dr. Janace Hoard.  Please call first thing tomorrow morning to schedule a follow-up within the next 1-2 days for reevaluation and continued medical management.  Return to the ED for any new or worsening symptoms as discussed.

## 2022-01-11 NOTE — ED Provider Notes (Signed)
[facility] EMERGENCY DEPT Provider Note   CSN: [id number] Arrival date & time: 01/11/22  1104     History {Add pertinent medical, surgical, social history, OB history to HPI:1} Chief Complaint  Patient presents with   Sore Throat    Amber Hendricks is a 46 y.o. female with history of recently diagnosed tonsillitis and concern for TMD presenting to the ED today for worsening sore throat and significantly increased difficulty swallowing.  Describes pain as 10/10, and barely able to get down water, saliva, and a "little bit of yogurt".  Has had difficulty opening her mouth enough to eat or even brush her teeth.  Denies fever, chills, shortness of breath, chest pain, neck stiffness, tongue swelling.  However does endorse swelling of the left side of the neck with exquisite localized pain going towards the ear.  Patient states "I was concerned I might lose my hearing".  Also reports some intermittent voice changes, stating she could barely speak and felt very hoarse.  Recently started on Augmentin when seen by PCP/UC, however without any improvement.  Tylenol provides some relief, but not much.  The history is provided by the patient and medical records.  Sore Throat     Home Medications Prior to Admission medications   Medication Sig Start Date End Date Taking? Authorizing Provider  amoxicillin-clavulanate (AUGMENTIN) 875-125 MG tablet Take 1 tablet by mouth every 12 (twelve) hours. 01/11/22  Yes Prince Rome, PA-C  acetaminophen (TYLENOL) 500 MG tablet Take 500 mg by mouth every 6 (six) hours as needed for headache.    [provider]  amoxicillin-clavulanate (AUGMENTIN) 875-125 MG tablet Take 1 tablet by mouth 2 (two) times daily. 01/06/22   Inda Coke, PA  escitalopram (LEXAPRO) 5 MG tablet TAKE 1 TABLET (5 MG TOTAL) BY MOUTH DAILY. 01/06/22   Inda Coke, PA  ibuprofen (ADVIL) 800 MG tablet Take 800 mg by mouth every 8 (eight) hours as needed.     [provider]  JUNEL FE 1/20 1-20 MG-MCG tablet Take 1 tablet by mouth daily. 11/12/17   [provider]  loratadine (CLARITIN) 10 MG tablet Take 10 mg by mouth daily as needed.    [provider]  montelukast (SINGULAIR) 10 MG tablet TAKE 1 TABLET BY MOUTH EVERYDAY AT BEDTIME 01/06/22   Inda Coke, PA  Omega-3 Fatty Acids (FISH OIL) 1000 MG CAPS Take 1 capsule by mouth daily in the afternoon.    [provider]  predniSONE (DELTASONE) 20 MG tablet Take 2 tablets (40 mg total) by mouth daily. 01/07/22   Inda Coke, PA  Turmeric 500 MG CAPS Take 1 capsule by mouth daily in the afternoon.    [provider]      Allergies    Patient has no known allergies.    Review of Systems   Review of Systems  HENT:  Positive for sore throat, trouble swallowing and voice change.     Physical Exam Updated Vital Signs BP 124/78 (BP Location: Right Arm)   Pulse 90   Temp 98.7 F (37.1 C) (Oral)   Resp 16   Ht 5\' 4"  (1.626 m)   Wt 58.1 kg   LMP 12/27/2021 (Approximate)   SpO2 100%   BMI 21.97 kg/m  Physical Exam Vitals and nursing note reviewed.  Constitutional:      General: She is not in acute distress.    Appearance: She is well-developed. She is not ill-appearing, toxic-appearing or diaphoretic.  HENT:  Head: Normocephalic and atraumatic.     Right Ear: Hearing, tympanic membrane, ear canal and external ear normal. No drainage. No mastoid tenderness.     Left Ear: Hearing, tympanic membrane, ear canal and external ear normal. No drainage. No mastoid tenderness.     Nose: Nose normal.     Mouth/Throat:     Mouth: Mucous membranes are dry. No oral lesions.     Pharynx: Oropharynx is clear. No pharyngeal swelling or uvula swelling.     Tonsils: Tonsillar exudate present. 1+ on the right. 2+ on the left.     Comments: Tenderness, erythema, and exudate appreciated of the tonsils.  Left tonsil appears more enlarged than the right.   Uvula appears deviated to the right.  With mild erythema of the oropharynx as well.  ABCs appear grossly intact.  No angioedema. Eyes:     General: Lids are normal. Gaze aligned appropriately.     Conjunctiva/sclera: Conjunctivae normal.  Neck:      Comments: Pinpoint localized tenderness with surrounding mild tenderness, and mild to moderate-sized mass appreciated on the left aspect of the neck, see above.  No meningismus or torticollis. Cardiovascular:     Rate and Rhythm: Normal rate and regular rhythm.     Heart sounds: No murmur heard. Pulmonary:     Effort: Pulmonary effort is normal. No respiratory distress.     Breath sounds: Normal breath sounds.     Comments: CTAB, able to communicate without difficulty, without increased respiratory effort Chest:     Chest wall: No tenderness or crepitus.  Abdominal:     Palpations: Abdomen is soft.     Tenderness: There is no abdominal tenderness.  Musculoskeletal:        General: No swelling.     Cervical back: Neck supple. No rigidity or crepitus.  Skin:    General: Skin is warm and dry.     Capillary Refill: Capillary refill takes less than 2 seconds.  Neurological:     Mental Status: She is alert and oriented to person, place, and time.  Psychiatric:        Mood and Affect: Mood normal.     ED Results / Procedures / Treatments   Labs (all labs ordered are listed, but only abnormal results are displayed) Labs Reviewed  COMPREHENSIVE METABOLIC PANEL - Abnormal; Notable for the following components:      Result Value   Glucose, Bld 141 (*)    ALT 79 (*)    All other components within normal limits  CBC WITH DIFFERENTIAL/PLATELET - Abnormal; Notable for the following components:   WBC 15.3 (*)    Neutro Abs 13.9 (*)    All other components within normal limits  GROUP A STREP BY PCR  RESP PANEL BY RT-PCR (FLU A&B, COVID) ARPGX2  LACTIC ACID, PLASMA  PREGNANCY, URINE    EKG None  Radiology CT Soft Tissue Neck W  Contrast  Result Date: 01/11/2022 CLINICAL DATA:  C/f epiglottitis or tonsilitis EXAM: CT NECK WITH CONTRAST TECHNIQUE: Multidetector CT imaging of the neck was performed using the standard protocol following the bolus administration of intravenous contrast. RADIATION DOSE REDUCTION: This exam was performed according to the departmental dose-optimization program which includes automated exposure control, adjustment of the mA and/or kV according to patient size and/or use of iterative reconstruction technique. CONTRAST:  34mL OMNIPAQUE IOHEXOL 300 MG/ML  SOLN COMPARISON:  None Available. FINDINGS: Pharynx and larynx: There is asymmetric soft tissue stranding and phlegmonous change in the  parapharyngeal space on the left (series 2, image 31). Within this region there appears to be a 2.3 x 1.5 x 1.2 Cm ill-defined air containing fluid collection (series 4, image 64). There is retropharyngeal edema without a discrete fluid collection. Salivary glands: No inflammation, mass, or stone. Thyroid: Normal. Lymph nodes: Asymmetrically prominent left level two-view lymph node measuring up to 8 mm (series 2, 35), favored to be reactive. Vascular: Negative. Limited intracranial: Negative. Visualized orbits: Negative. Mastoids and visualized paranasal sinuses: Normal Skeleton: Unchanged reversal of the normal cervical lordosis. Upper chest: Negative. Other: None. IMPRESSION: 1. 2.3 x 1.3 x 1.2 cm peritonsillar abscess in the left parapharyngeal space. 2.  No evidence of epiglottitis. Electronically Signed   By: Marin Roberts M.D.   On: 01/11/2022 17:18    Procedures Procedures  {Document cardiac monitor, telemetry assessment procedure when appropriate:1}  Medications Ordered in ED Medications  iohexol (OMNIPAQUE) 300 MG/ML solution 100 mL (75 mLs Intravenous Contrast Given 01/11/22 1700)    ED Course/ Medical Decision Making/ A&P                           Medical Decision Making Amount and/or Complexity of Data  Reviewed Labs: ordered. Radiology: ordered.  Risk Prescription drug management.   46 y.o. female presents to the ED for concern of Sore Throat   This involves an extensive number of treatment options, and is a complaint that carries with it a high risk of complications and morbidity.  The emergent differential diagnosis prior to evaluation includes, but is not limited to: RPA, PTA, tonsillitis, acute pharyngitis  This is not an exhaustive differential.   Past Medical History / Co-morbidities / Social History: Hx of recently diagnosed tonsillitis and suspicion for TJD Social Determinants of Health include: None  Additional History:  Obtained by chart review.  Notably recent UC/PCP notes.  Documents start of Augmentin and noted size variation of tonsils however right tonsil appeared more enlarged than the left on their exam.    Lab Tests: I ordered, and personally interpreted labs.  The pertinent results include:   Negative COVID, flu, and group A strep Lactic acid 0.9, second lactic acid deferred Urine pregnancy negative WBC 15.3 with leukocytosis Nonfasting glucose 141  Imaging Studies: I ordered imaging studies including CT neck soft tissue.   I independently visualized and interpreted imaging which showed peritonsillar abscess on the left side, which correlates clinically. I agree with the radiologist interpretation.  ED Course: Pt well-appearing on exam.  ***.   Peritonsillar abscess began to spontaneously drain, did not appear to need an I&D, opening appeared sufficient enough for suctioning.  Abscess was suctioned and moderate amount of pus was removed.  Patient tolerated this well and without complications.  Reports remarkable improvement after suctioning.  Pain managed in the ED. Patient in NAD and in good condition at time of discharge.  Disposition: After consideration the patient's encounter today, I do not feel today's workup suggests an emergent condition requiring  admission or immediate intervention beyond what has been performed at this time.  Safe for discharge; instructed to return immediately for worsening symptoms, change in symptoms or any other concerns.  I have reviewed the patients home medicines and have made adjustments as needed.  Discussed course of treatment with the patient, whom demonstrated understanding.  Patient in agreement and has no further questions.    I discussed this case with my attending physician Dr. Maylon Peppers, who agreed with the  proposed treatment course and cosigned this note including patient's presenting symptoms, physical exam, and planned diagnostics and interventions.  Attending physician stated agreement with plan or made changes to plan which were implemented.     This chart was dictated using voice recognition software.  Despite best efforts to proofread, errors can occur which can change the documentation meaning.   {Document critical care time when appropriate:1} {Document review of labs and clinical decision tools ie heart score, Chads2Vasc2 etc:1}  {Document your independent review of radiology images, and any outside records:1} {Document your discussion with family members, caretakers, and with consultants:1} {Document social determinants of health affecting pt's care:1} {Document your decision making why or why not admission, treatments were needed:1} Final Clinical Impression(s) / ED Diagnoses Final diagnoses:  Peritonsillar abscess  Sore throat    Rx / DC Orders ED Discharge Orders          Ordered    amoxicillin-clavulanate (AUGMENTIN) 875-125 MG tablet  Every 12 hours       Note to Pharmacy: Extending Augmentin course by 7 days   01/11/22 1811

## 2022-01-11 NOTE — ED Triage Notes (Signed)
Pt via pov from home with tonsillitis and TMD; was seen on Tuesday and put on amoxicillin and steroids with no relief. She is unable to chew due to the TMD and swallowing is painful. Pt alert & oriented, nad noted.

## 2022-02-03 ENCOUNTER — Telehealth: Payer: BLUE CROSS/BLUE SHIELD | Admitting: Nurse Practitioner

## 2022-02-03 DIAGNOSIS — J0140 Acute pansinusitis, unspecified: Secondary | ICD-10-CM | POA: Diagnosis not present

## 2022-02-03 DIAGNOSIS — R051 Acute cough: Secondary | ICD-10-CM

## 2022-02-03 MED ORDER — DOXYCYCLINE HYCLATE 100 MG PO TABS: 100.0000 mg | ORAL_TABLET | Freq: Two times a day (BID) | ORAL | 0 refills | Status: AC

## 2022-02-03 NOTE — Progress Notes (Signed)
Virtual Visit Consent   Amber Hendricks, you are scheduled for a virtual visit with a [facility] provider today. Just as with appointments in the office, your consent must be obtained to participate. Your consent will be active for this visit and any virtual visit you may have with one of our providers in the next 365 days. If you have a MyChart account, a copy of this consent can be sent to you electronically.  As this is a virtual visit, video technology does not allow for your provider to perform a traditional examination. This may limit your provider's ability to fully assess your condition. If your provider identifies any concerns that need to be evaluated in person or the need to arrange testing (such as labs, EKG, etc.), we will make arrangements to do so. Although advances in technology are sophisticated, we cannot ensure that it will always work on either your end or our end. If the connection with a video visit is poor, the visit may have to be switched to a telephone visit. With either a video or telephone visit, we are not always able to ensure that we have a secure connection.  By engaging in this virtual visit, you consent to the provision of healthcare and authorize for your insurance to be billed (if applicable) for the services provided during this visit. Depending on your insurance coverage, you may receive a charge related to this service.  I need to obtain your verbal consent now. Are you willing to proceed with your visit today? Mattilyn [place] has provided verbal consent on 02/03/2022 for a virtual visit (video or telephone). Viviano Simas, FNP  Date: 02/03/2022 8:24 AM  Virtual Visit via Video Note   I, Viviano Simas, connected with  RENAE MOTTLEY  ([id number], 06-07-1975) on 02/03/22 at  8:45 AM EST by a video-enabled telemedicine application and verified that I am speaking with the correct person using two identifiers.  Location: Patient: Virtual Visit Location Patient:  Home Provider: Virtual Visit Location Provider: Home Office   I discussed the limitations of evaluation and management by telemedicine and the availability of in person appointments. The patient expressed understanding and agreed to proceed.    History of Present Illness: Amber Hendricks is a 46 y.o. who identifies as a female who was assigned female at birth, and is being seen today with complaints of sinus congestion and cough that have been present for the past two weeks.   She was in the ER two weeks ago with a tonsillar abscess and the URI symptoms started when she left the ED.  She was on Augmentin from 01/11/22 for 7 days. Has been off antibiotics for one week now.   She has not taken a COVID test since being in the ED.  She has been using Sudafed and Nyquil for relief.  Today she feels her sinuses are worse than her cough   She has used an inhaler in the past   Problems:  Patient Active Problem List   Diagnosis Date Noted   Situational anxiety 01/11/2020   Headache 05/26/2013    Allergies: No Known Allergies Medications:  Observations/Objective: Patient is well-developed, well-nourished in no acute distress.  Resting comfortably at home.  Head is normocephalic, atraumatic.  No labored breathing. Speech is clear and coherent with logical content.  Patient is alert and oriented at baseline.    Assessment and Plan: 1. Acute non-recurrent pansinusitis  - doxycycline (VIBRA-TABS) 100 MG tablet; Take 1 tablet (100 mg  total) by mouth 2 (two) times daily for 10 days.  Dispense: 20 tablet; Refill: 0  2. Acute cough    Continue Mucinex Push fluids  Follow up with PCP  Vitamin C for immune support   Follow Up Instructions: I discussed the assessment and treatment plan with the patient. The patient was provided an opportunity to ask questions and all were answered. The patient agreed with the plan and demonstrated an understanding of the instructions.  A copy of instructions  were sent to the patient via MyChart unless otherwise noted below.    The patient was advised to call back or seek an in-person evaluation if the symptoms worsen or if the condition fails to improve as anticipated.  Time:  I spent 15 minutes with the patient via telehealth technology discussing the above problems/concerns.    Viviano Simas, FNP

## 2022-02-17 ENCOUNTER — Encounter: Payer: BLUE CROSS/BLUE SHIELD | Admitting: Physician Assistant

## 2022-03-02 ENCOUNTER — Encounter: Payer: Self-pay | Admitting: Physician Assistant

## 2022-03-03 ENCOUNTER — Telehealth: Payer: Self-pay | Admitting: Physician Assistant

## 2022-03-03 ENCOUNTER — Ambulatory Visit (INDEPENDENT_AMBULATORY_CARE_PROVIDER_SITE_OTHER): Payer: BLUE CROSS/BLUE SHIELD | Admitting: Physician Assistant

## 2022-03-03 ENCOUNTER — Encounter: Payer: Self-pay | Admitting: Physician Assistant

## 2022-03-03 ENCOUNTER — Encounter: Payer: BLUE CROSS/BLUE SHIELD | Admitting: Physician Assistant

## 2022-03-03 VITALS — BP 104/70 | HR 71 | Temp 97.8°F | Ht 64.0 in | Wt 138.0 lb

## 2022-03-03 DIAGNOSIS — K649 Unspecified hemorrhoids: Secondary | ICD-10-CM | POA: Diagnosis not present

## 2022-03-03 DIAGNOSIS — F418 Other specified anxiety disorders: Secondary | ICD-10-CM | POA: Diagnosis not present

## 2022-03-03 DIAGNOSIS — Z0000 Encounter for general adult medical examination without abnormal findings: Secondary | ICD-10-CM | POA: Diagnosis not present

## 2022-03-03 DIAGNOSIS — R7309 Other abnormal glucose: Secondary | ICD-10-CM | POA: Diagnosis not present

## 2022-03-03 LAB — CBC WITH DIFFERENTIAL/PLATELET
Basophils Absolute: 0 10*3/uL (ref 0.0–0.1)
Basophils Relative: 0.9 % (ref 0.0–3.0)
Eosinophils Absolute: 0.1 10*3/uL (ref 0.0–0.7)
Eosinophils Relative: 1.8 % (ref 0.0–5.0)
HCT: 39.6 % (ref 36.0–46.0)
Hemoglobin: 13.6 g/dL (ref 12.0–15.0)
Lymphocytes Relative: 34.6 % (ref 12.0–46.0)
Lymphs Abs: 1.7 10*3/uL (ref 0.7–4.0)
MCHC: 34.3 g/dL (ref 30.0–36.0)
MCV: 89.3 fl (ref 78.0–100.0)
Monocytes Absolute: 0.3 10*3/uL (ref 0.1–1.0)
Monocytes Relative: 7.2 % (ref 3.0–12.0)
Neutro Abs: 2.7 10*3/uL (ref 1.4–7.7)
Neutrophils Relative %: 55.5 % (ref 43.0–77.0)
Platelets: 234 10*3/uL (ref 150.0–400.0)
RBC: 4.43 Mil/uL (ref 3.87–5.11)
RDW: 13.5 % (ref 11.5–15.5)
WBC: 4.8 10*3/uL (ref 4.0–10.5)

## 2022-03-03 LAB — COMPREHENSIVE METABOLIC PANEL
ALT: 26 U/L (ref 0–35)
AST: 19 U/L (ref 0–37)
Albumin: 4.5 g/dL (ref 3.5–5.2)
Alkaline Phosphatase: 71 U/L (ref 39–117)
BUN: 10 mg/dL (ref 6–23)
CO2: 30 mEq/L (ref 19–32)
Calcium: 9.4 mg/dL (ref 8.4–10.5)
Chloride: 103 mEq/L (ref 96–112)
Creatinine, Ser: 0.73 mg/dL (ref 0.40–1.20)
GFR: 100.01 mL/min (ref >60.00)
Glucose, Bld: 81 mg/dL (ref 70–99)
Potassium: 4.1 mEq/L (ref 3.5–5.1)
Sodium: 139 mEq/L (ref 135–145)
Total Bilirubin: 0.5 mg/dL (ref 0.2–1.2)
Total Protein: 6.6 g/dL (ref 6.0–8.3)

## 2022-03-03 LAB — HEMOGLOBIN A1C: Hgb A1c MFr Bld: 5.6 % (ref 4.6–6.5)

## 2022-03-03 LAB — LIPID PANEL
Cholesterol: 190 mg/dL (ref 0–200)
HDL: 69.3 mg/dL (ref >39.00)
LDL Cholesterol: 94 mg/dL (ref 0–99)
NonHDL: 120.69
Total CHOL/HDL Ratio: 3
Triglycerides: 133 mg/dL (ref 0.0–149.0)
VLDL: 26.6 mg/dL (ref 0.0–40.0)

## 2022-03-03 MED ORDER — HYDROCORTISONE ACETATE 25 MG RE SUPP: 25.0000 mg | Freq: Two times a day (BID) | RECTAL | 0 refills | Status: DC

## 2022-03-03 MED ORDER — HYDROCORTISONE (PERIANAL) 2.5 % EX CREA: 1.0000 | TOPICAL_CREAM | Freq: Two times a day (BID) | CUTANEOUS | 0 refills | Status: DC

## 2022-03-03 MED ORDER — LIDOCAINE HCL 2 % EX GEL: CUTANEOUS | 0 refills | Status: DC

## 2022-03-03 NOTE — Telephone Encounter (Signed)
Spoke to pt clarified pharmacy [facility]. Told her will send Rx there for Lidocaine since [facility] is out. Pt verbalized understanding. Rx sent.

## 2022-03-03 NOTE — Telephone Encounter (Signed)
Discussed with Lelon Mast that Anusol suppository needs PA. Lelon Mast said change Rx to Anusol cream. Rx sent.   Spoke to pt told her Lelon Mast changed Rx to Anusol Cream, due to insurance. I sent new Rx to [facility]. Pt verbalized understanding.

## 2022-03-03 NOTE — Telephone Encounter (Signed)
[facility]ed [facility] spoke to [facility], she said Lidocaine jelly is not available at all. I asked her if there was something else available? She said there is Lidocaine 4% cream OTC. Told her okay, thank you and to cancel Rx for Lidocaine jelly. Marylene Land verbalized understanding.  Discussed lidocaine with Lelon Mast, she said to let pt know to get Lidocaine 4% cream OTC to use.  Spoke to pt told her Lidocaine jelly is no longer available,but you need to get Lidocaine 4% cream OTC at the pharmacy. Pt verbalized understanding.

## 2022-03-03 NOTE — Progress Notes (Signed)
Subjective:    Amber Hendricks is a 46 y.o. female and is here for a comprehensive physical exam.  HPI  There are no preventive care reminders to display for this patient.   Acute Concerns: No acute concerns discussed  Chronic Issues: Hemorrhoids Patient is complaining of a hemorrhoid flare up. 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 She states that she gets them periodically, but this is the worst it has been. She suspects her straining while defecating and squatting for a period of time talking to children could be the reason for this flare up. Patient explains that there is blood when she wipes while in the bathroom, but no blood in the toilet. Denies dizziness, lightheadedness, excessive fatigue. She is using topical preparation H for this.  Anxiety  Patient reports that her anxiety is well managed with 5 mg Lexapro.  Allergies  Patient reports that she only uses her singulair when she experiences congestion.  Health Maintenance: Mammogram -- last completed on 07/08/2021. PAP -- last completed on 01/08/2021. Exercise -- Patient does not currently participate in exercise.   Mood -- she states that she is in a stable mood.  UTD with dentist? - She is UTD on dental care. UTD with eye doctor? - She is UTD on vision care.  Weight history: Wt Readings from Last 10 Encounters:  03/03/22 138 lb (62.6 kg)  01/11/22 128 lb (58.1 kg)  01/06/22 135 lb 8 oz (61.5 kg)  12/15/21 137 lb (62.1 kg)  04/23/21 131 lb 6.1 oz (59.6 kg)  04/18/21 137 lb 4 oz (62.3 kg)  04/14/21 137 lb 4 oz (62.3 kg)  01/11/20 135 lb (61.2 kg)  12/20/19 135 lb (61.2 kg)  09/22/19 135 lb (61.2 kg)   Body mass index is 23.69 kg/m. Patient's last menstrual period was 02/25/2022 (approximate).  Alcohol use:  reports no history of alcohol use.  Tobacco use:  Tobacco Use: Medium Risk (03/03/2022)   Patient History    Smoking Tobacco Use: Former    Smokeless Tobacco Use:  Never    Passive Exposure: Not on file   Eligible for lung cancer screening? no     03/03/2022    9:46 AM  Depression screen PHQ 2/9  Decreased Interest 0  Down, Depressed, Hopeless 0  PHQ - 2 Score 0     Other providers/specialists: Patient Care Team: Jarold Motto, [place] as PCP - General (Physician Assistant) Marcelle Overlie, MD as Consulting Physician (Obstetrics and Gynecology)    PMHx, SurgHx, SocialHx, Medications, and Allergies were reviewed in the Visit Navigator and updated as appropriate.   Past Medical History:  Diagnosis Date   Allergy    Anxiety    Arthritis    Chronic tension headaches    /cervicogenic HAs- Dr Terrace Arabia consults 3/15   Cysts of both ovaries    Former smoker    Frequent headaches    History of chicken pox    History of migraine headaches    Menstual- no aura   Hyperlipidemia    Migraines    UTI (urinary tract infection)      Past Surgical History:  Procedure Laterality Date   WISDOM TOOTH EXTRACTION       Family History  Problem Relation Age of Onset   Osteoporosis Mother    Hyperlipidemia Mother    Osteoarthritis Father        (neck and shoulders)   Heart attack Father    Hypertension Father  Arthritis Father    Parkinson's disease Father    Hearing loss Maternal Grandmother    Hypertension Maternal Grandmother    Lung disease Maternal Grandmother        non-smoker; required oxygen   Diabetes Maternal Grandfather    Heart attack Maternal Grandfather 17   Dementia Paternal Grandmother    Hearing loss Paternal Grandmother    Heart disease Paternal Grandfather    Early death Paternal Grandfather    Heart attack Paternal Grandfather    Colon cancer Maternal Uncle     Social History   Tobacco Use   Smoking status: Former    Types: Cigarettes    Quit date: 09/12/2009    Years since quitting: 12.4   Smokeless tobacco: Never   Tobacco comments:    Quit five years ago.  Vaping Use   Vaping Use: Never used  Substance  Use Topics   Alcohol use: No    Comment: occasionally   Drug use: No    Review of Systems:   Review of Systems  Constitutional:  Negative for chills, fever, malaise/fatigue and weight loss.  HENT:  Negative for hearing loss, sinus pain and sore throat.   Respiratory:  Negative for cough and hemoptysis.   Cardiovascular:  Negative for chest pain, palpitations, leg swelling and PND.  Gastrointestinal:  Negative for abdominal pain, constipation, diarrhea, heartburn, nausea and vomiting.  Genitourinary:  Negative for dysuria, frequency and urgency.  Musculoskeletal:  Negative for back pain, myalgias and neck pain.  Skin:  Negative for itching and rash.  Endo/Heme/Allergies:  Negative for polydipsia.  Psychiatric/Behavioral:  Negative for depression. The patient is not nervous/anxious.     Objective:   BP 104/70 (BP Location: Left Arm, Patient Position: Sitting, Cuff Size: Normal)   Pulse 71   Temp 97.8 F (36.6 C) (Temporal)   Ht 5\' 4"  (1.626 m)   Wt 138 lb (62.6 kg)   LMP 02/25/2022 (Approximate)   SpO2 97%   BMI 23.69 kg/m  Body mass index is 23.69 kg/m.   General Appearance:    Alert, cooperative, no distress, appears stated age  Head:    Normocephalic, without obvious abnormality, atraumatic  Eyes:    PERRL, conjunctiva/corneas clear, EOM's intact, fundi    benign, both eyes  Ears:    Normal TM's and external ear canals, both ears  Nose:   Nares normal, septum midline, mucosa normal, no drainage    or sinus tenderness  Throat:   Lips, mucosa, and tongue normal; teeth and gums normal  Neck:   Supple, symmetrical, trachea midline, no adenopathy;    thyroid:  no enlargement/tenderness/nodules; no carotid   bruit or JVD  Back:     Symmetric, no curvature, ROM normal, no CVA tenderness  Lungs:     Clear to auscultation bilaterally, respirations unlabored  Chest Wall:    No tenderness or deformity   Heart:    Regular rate and rhythm, S1 and S2 normal, no murmur, rub or  gallop  Breast Exam:    Deferred  Abdomen:     Soft, non-tender, bowel sounds active all four quadrants,    no masses, no organomegaly  Genitalia:    Vaginal area not assessed Anus with pea-sized thrombosed external hemorrhoid  Extremities:   Extremities normal, atraumatic, no cyanosis or edema  Pulses:   2+ and symmetric all extremities  Skin:   Skin color, texture, turgor normal, no rashes or lesions  Lymph nodes:   Cervical, supraclavicular, and axillary nodes  normal  Neurologic:   CNII-XII intact, normal strength, sensation and reflexes    throughout    Assessment/Plan:   Routine physical examination Today patient counseled on age appropriate routine health concerns for screening and prevention, each reviewed and up to date or declined. Immunizations reviewed and up to date or declined. Labs ordered and reviewed. Risk factors for depression reviewed and negative. Hearing function and visual acuity are intact. ADLs screened and addressed as needed. Functional ability and level of safety reviewed and appropriate. Education, counseling and referrals performed based on assessed risks today. Patient provided with a copy of personalized plan for preventive services.  Situational anxiety Well controlled Continue Lexapro 5 mg daily Denies SI/HI Follow-up in 6-12 months, sooner if concerns  Hemorrhoids, unspecified hemorrhoid type No red flags Recommend anusol suppository and topical lidocaine Recommend miralax to prevent constipation Avoid sitting for prolonged periods of time and push fluids and fiber Consider GI referral if persists or to discuss banding  Elevated glucose Update glucose Discouraged soda intake  I,Verona Buck,acting as a Neurosurgeon for [employer], PA.,have documented all relevant documentation on the behalf of Jarold Motto, PA,as directed by  Jarold Motto, PA while in the presence of Jarold Motto, [place].  I, Jarold Motto, [place], have reviewed all  documentation for this visit. The documentation on 03/03/22 for the exam, diagnosis, procedures, and orders are all accurate and complete.  Jarold Motto, PA-C [facility]

## 2022-03-03 NOTE — Telephone Encounter (Signed)
Caller states: - For patient's lidocaine they do not carry the 2% jelly solution, they only have the 2% viscous solution   Caller requests: -Confirmation from PCP if it's okay to use the 2% viscous solution or not   Caller can be reached at [phone].

## 2022-03-03 NOTE — Patient Instructions (Signed)
It was great to see you!  To treat your constipation today: -Take 100 mg of docusate sodium (also known as Colace, however generic is fine!) by mouth twice daily to soften stools -Drink at least 64 oz of water daily -Add in 1 capful of polyethylene glycol (also known as Miralax, however generic is fine!) to beverage of choice daily  Goal is to have a formed, soft bowel movement regularly   -After a few days if no success, may increase to a total of 2 capfuls of polyethylene glycol/ Miralax daily  If still no results, please call the office   To treat your hemorrhoid use the anusol suppositories and topical lidocaine. Consider banding -- [facility] does this and I can place referral if needed.  Please go to the lab for blood work.   Our office will call you with your results unless you have chosen to receive results via MyChart.  If your blood work is normal we will follow-up each year for physicals and as scheduled for chronic medical problems.  If anything is abnormal we will treat accordingly and get you in for a follow-up.  Take care,  Lelon Mast

## 2022-03-03 NOTE — Telephone Encounter (Signed)
Patient states: -Pharmacy informed her that a PA will be needed before they can fill hydrocortisone (ANUSOL-HC) 25 MG suppository   Patient requests: -This process be started as soon as possible

## 2022-03-03 NOTE — Telephone Encounter (Signed)
Patient states her pharmacy is out of stock with lidocaine - would like to try [facility] or another pharmacy

## 2022-07-21 ENCOUNTER — Other Ambulatory Visit: Payer: Self-pay | Admitting: Physician Assistant

## 2022-08-18 ENCOUNTER — Other Ambulatory Visit: Payer: Self-pay | Admitting: Physician Assistant

## 2022-08-19 ENCOUNTER — Other Ambulatory Visit: Payer: Self-pay | Admitting: Obstetrics and Gynecology

## 2022-08-19 DIAGNOSIS — Z1231 Encounter for screening mammogram for malignant neoplasm of breast: Secondary | ICD-10-CM

## 2022-09-08 ENCOUNTER — Ambulatory Visit
Admission: RE | Admit: 2022-09-08 | Discharge: 2022-09-08 | Disposition: A | Discharge status: Home / Self Care | Payer: 59 | Source: Ambulatory Visit | Attending: Obstetrics and Gynecology | Admitting: Obstetrics and Gynecology

## 2022-09-08 DIAGNOSIS — Z1231 Encounter for screening mammogram for malignant neoplasm of breast: Secondary | ICD-10-CM

## 2023-01-28 IMAGING — MG MM DIGITAL DIAGNOSTIC UNILAT*L* W/ TOMO W/ CAD
4 series · 4 of 12 positions shown · non-contrast
Comparison: Previous exam(s).

CLINICAL DATA: Screening recall for a possible left breast
asymmetry.

EXAM:
DIGITAL DIAGNOSTIC UNILATERAL LEFT MAMMOGRAM WITH TOMOSYNTHESIS AND
CAD
TECHNIQUE: Left digital diagnostic mammography and breast tomosynthesis was
performed. The images were evaluated with computer-aided detection.

[L MLO synth-2D]
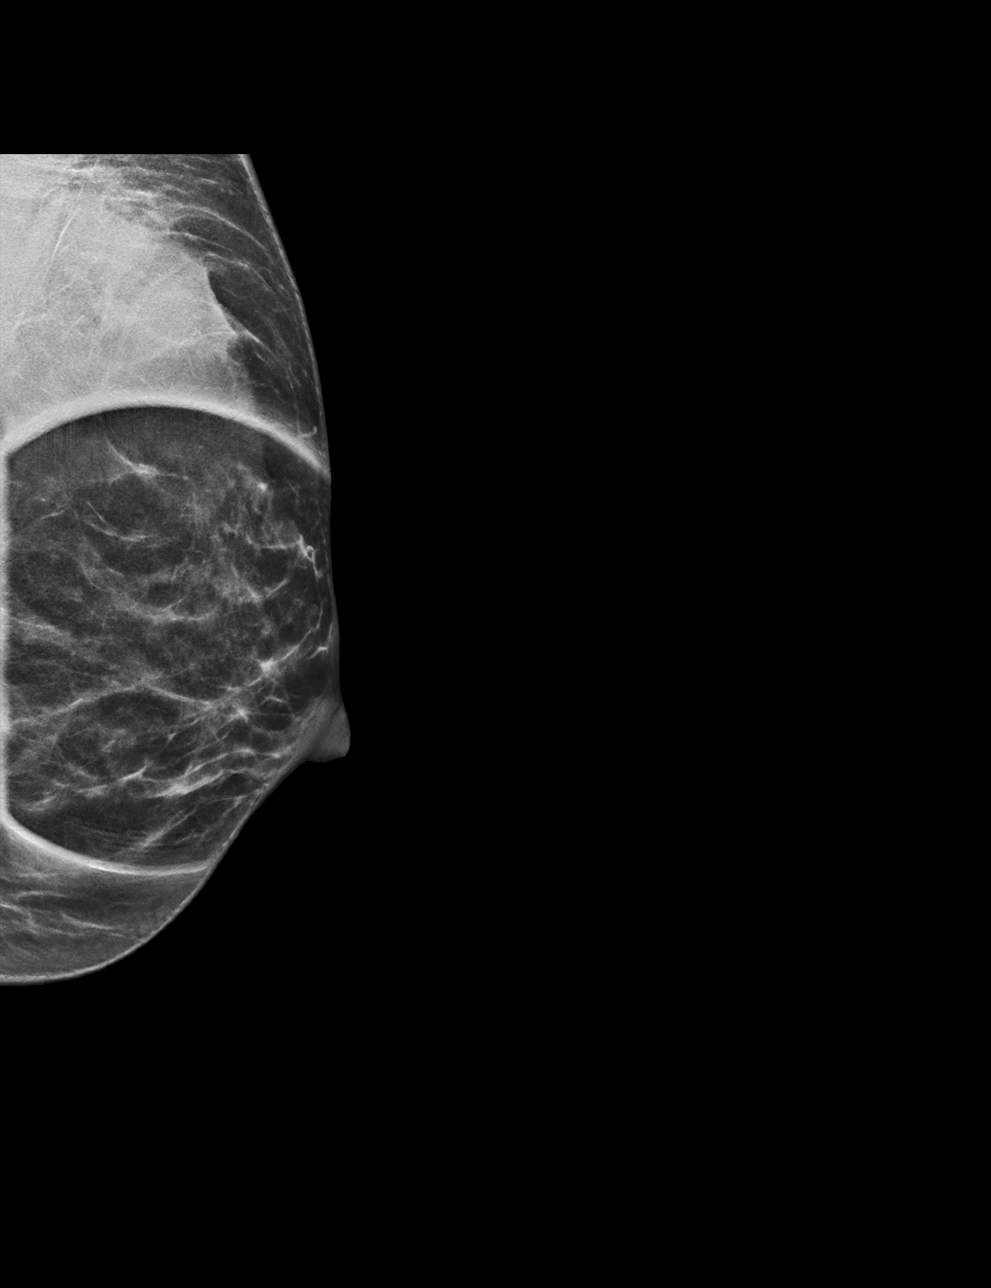

[L ML synth-2D]
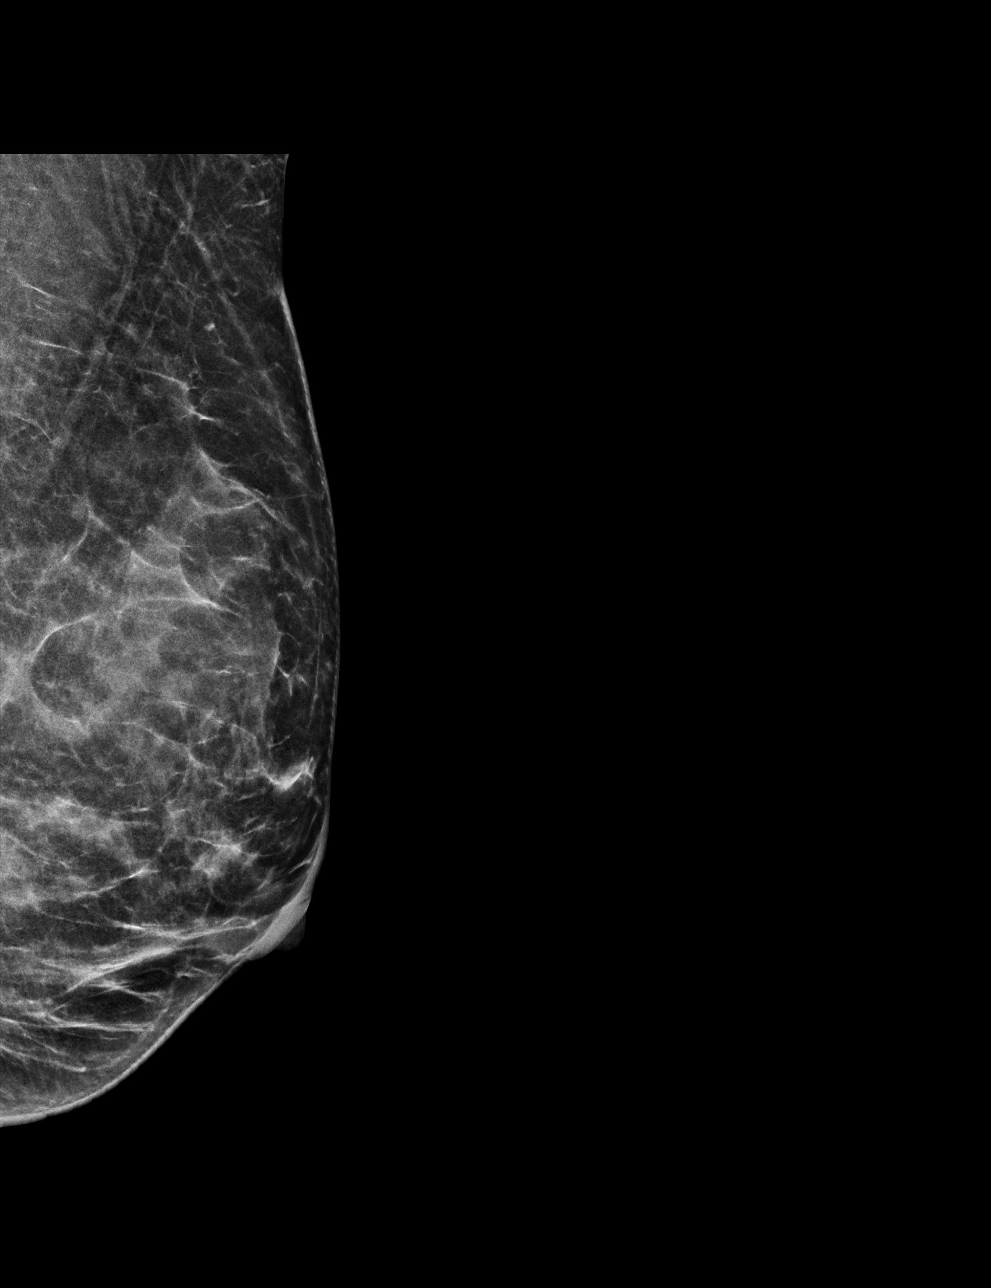

[L ML tomo · tomo slice 23/45.0]
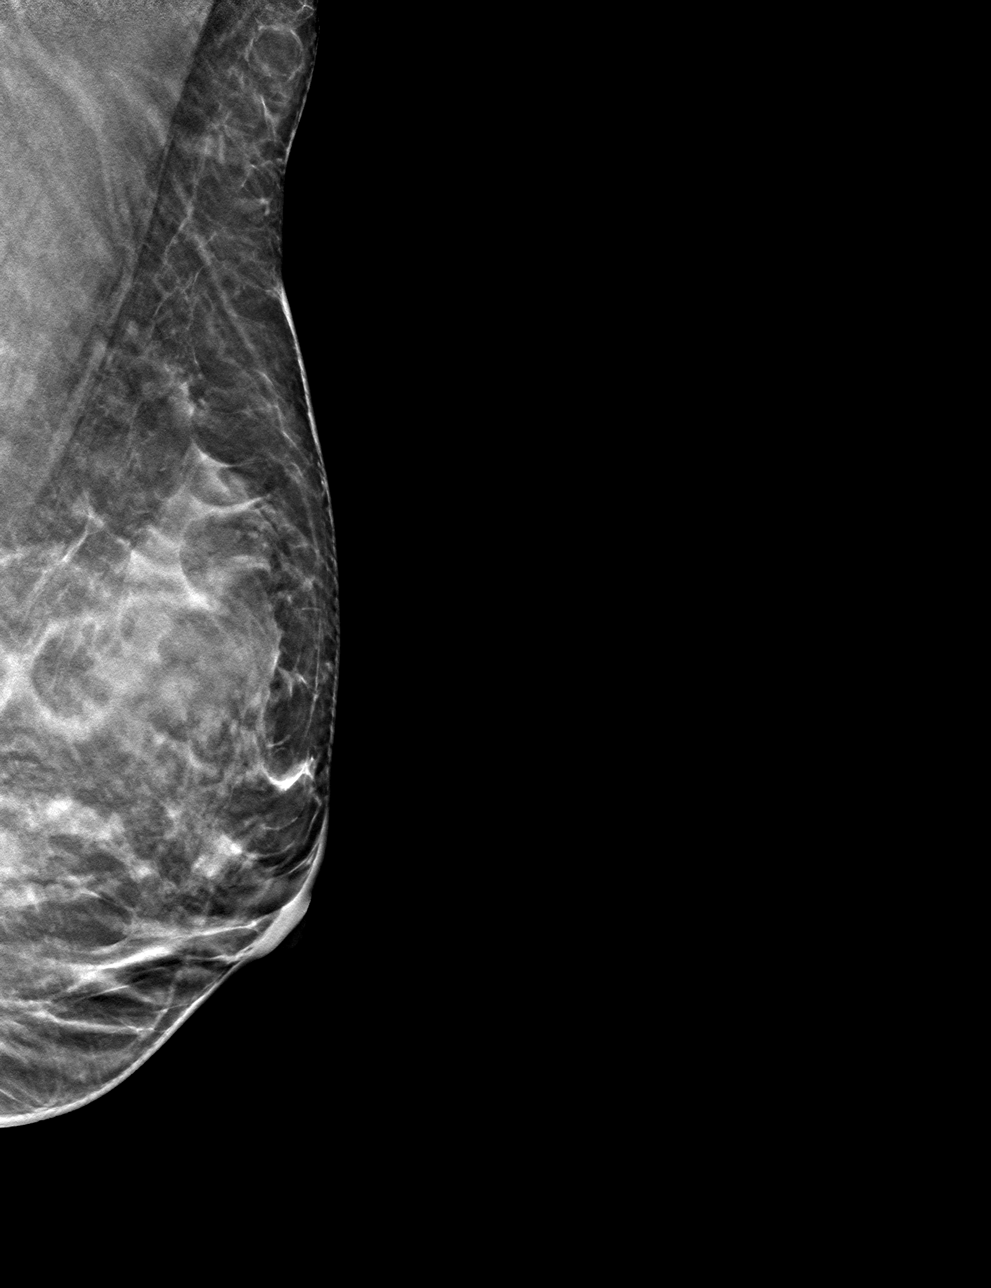

[L MLO tomo · tomo slice 21/40.0]
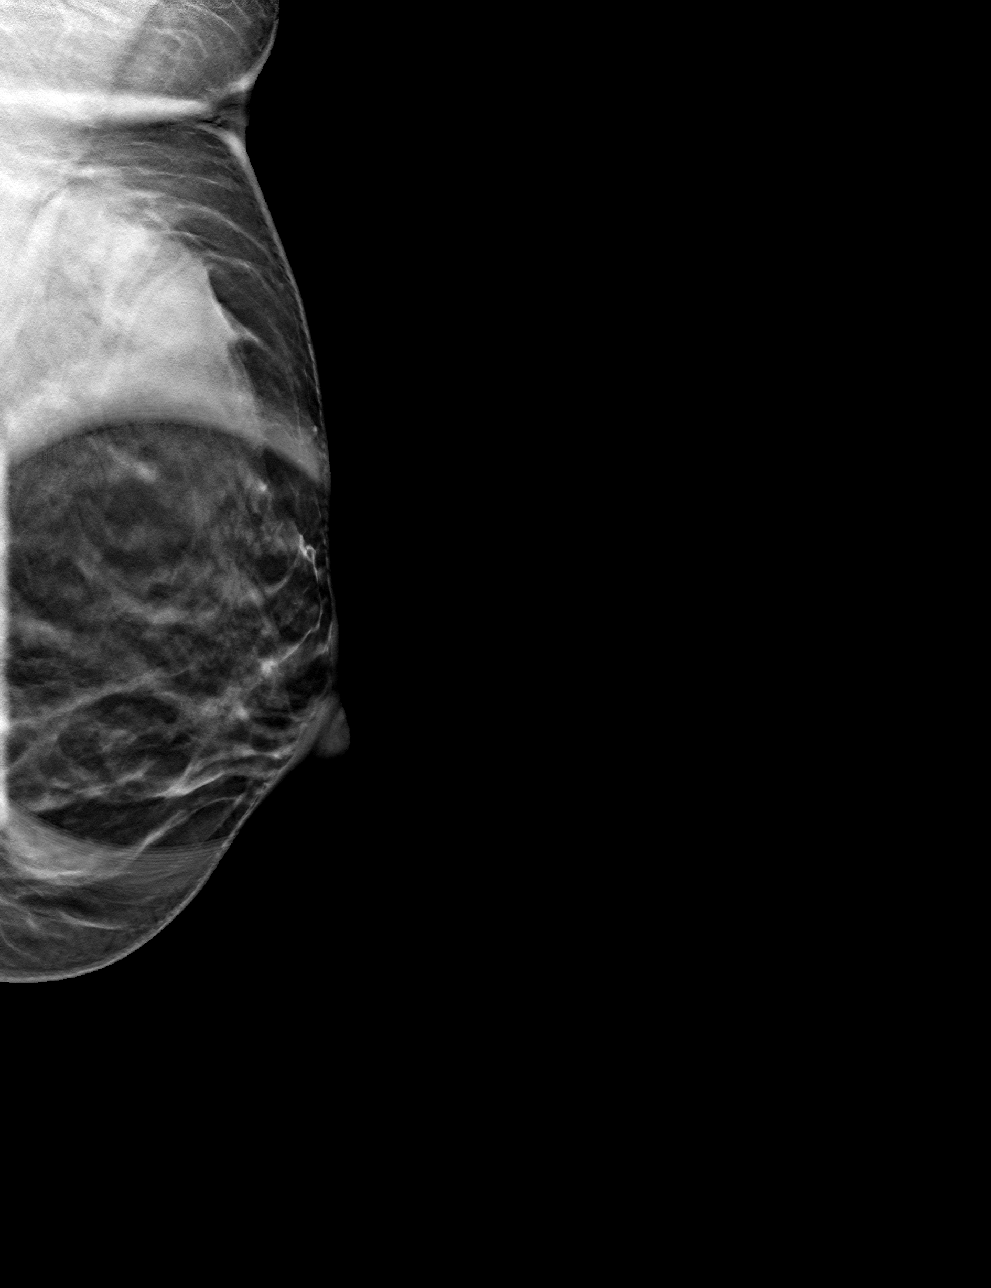

[4 of 12 positions shown; findings below may reference images not displayed]

ACR Breast Density Category c: The breast tissue is heterogeneously
dense, which may obscure small masses.
FINDINGS: The possible asymmetry, noted on the current screening left breast
MLO view, disperses on spot compression imaging consistent with
superimposed fibroglandular tissue. There is no underlying mass or
significant residual asymmetry. There are no areas of architectural
distortion and no suspicious calcifications.
IMPRESSION: No evidence of breast malignancy.

RECOMMENDATION:
Screening mammogram in one year.(Code:PQ-[id number])

I have discussed the findings and recommendations with the patient.
If applicable, a reminder letter will be sent to the patient
regarding the next appointment.

BI-RADS CATEGORY  1: Negative.

## 2023-02-15 NOTE — Progress Notes (Signed)
Amber Hendricks is a 47 y.o. female {ELNP/FU:31256} History of Present Illness:   Health Maintenance Due  Topic Date Due   Colonoscopy  Never done   INFLUENZA VACCINE  10/22/2022   No chief complaint on file.  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  Vaping Use   Vaping status: Never Used  Substance Use Topics   Alcohol use: No    Comment: occasionally   Drug use: No   Past Surgical History:  Procedure Laterality Date   WISDOM TOOTH EXTRACTION     Family History  Problem Relation Age of Onset   Osteoporosis Mother    Hyperlipidemia Mother    Osteoarthritis Father        (neck and shoulders)   Heart attack Father    Hypertension Father    Arthritis Father    Parkinson's disease Father    Lung disease Maternal Grandmother        non-smoker; required oxygen   Hearing loss Maternal Grandmother    Hypertension Maternal Grandmother    Pulmonary fibrosis Maternal Grandmother    Diabetes Maternal Grandfather    Heart attack Maternal Grandfather 45   Dementia Paternal Grandmother    Hearing loss Paternal Grandmother    Heart disease Paternal Grandfather    Early death Paternal Grandfather    Heart attack Paternal Grandfather    Prostate cancer Maternal Uncle    No  Known Allergies Current Medications:   Current Outpatient Medications:    acetaminophen (TYLENOL) 500 MG tablet, Take 500 mg by mouth every 6 (six) hours as needed for headache., Disp: , Rfl:    escitalopram (LEXAPRO) 5 MG tablet, TAKE 1 TABLET (5 MG TOTAL) BY MOUTH DAILY., Disp: 90 tablet, Rfl: 1   hydrocortisone (ANUSOL-HC) 2.5 % rectal cream, Place 1 Application rectally 2 (two) times daily., Disp: 30 g, Rfl: 0   ibuprofen (ADVIL) 800 MG tablet, Take 800 mg by mouth every 8 (eight) hours as needed., Disp: , Rfl:    JUNEL FE 1/20 1-20 MG-MCG tablet, Take 1 tablet by mouth daily., Disp: , Rfl: 3   loratadine (CLARITIN) 10 MG tablet, Take 10 mg by mouth daily as needed., Disp: , Rfl:    montelukast (SINGULAIR) 10 MG tablet, TAKE 1 TABLET BY MOUTH EVERYDAY AT BEDTIME, Disp: 90 tablet, Rfl: 1   Omega-3 Fatty Acids (FISH OIL) 1000 MG CAPS, Take 1 capsule by mouth daily in the afternoon., Disp: , Rfl:    Turmeric 500 MG CAPS, Take 1 capsule by mouth daily in the afternoon., Disp: , Rfl:  Review of Systems:   ROS See pertinent positives and negatives as per the HPI.  Vitals:   There were no vitals filed for this visit.   There is no height or weight on file to calculate BMI.  Physical Exam:   Physical Exam  Assessment and Plan:   There are no diagnoses linked to this encounter.            I,Emily Lagle,acting as a Neurosurgeon for [employer], PA.,have documented all relevant documentation on the behalf of Jarold Motto, PA,as directed by  Jarold Motto, PA while in the presence of Jarold Motto, [place].  *** (refresh reminder)  I, Jarold Motto, PA, have reviewed all documentation for this visit. The documentation on 02/15/23 for the exam, diagnosis, procedures, and orders are all accurate and complete.  Jarold Motto, PA-C

## 2023-02-16 ENCOUNTER — Encounter: Payer: Self-pay | Admitting: Physician Assistant

## 2023-02-16 ENCOUNTER — Ambulatory Visit (INDEPENDENT_AMBULATORY_CARE_PROVIDER_SITE_OTHER): Payer: 59 | Admitting: Physician Assistant

## 2023-02-16 VITALS — BP 100/72 | HR 76 | Temp 97.5°F | Ht 64.0 in | Wt 148.4 lb

## 2023-02-16 DIAGNOSIS — J029 Acute pharyngitis, unspecified: Secondary | ICD-10-CM

## 2023-02-16 DIAGNOSIS — G43909 Migraine, unspecified, not intractable, without status migrainosus: Secondary | ICD-10-CM | POA: Insufficient documentation

## 2023-02-16 LAB — POCT RAPID STREP A (OFFICE): Rapid Strep A Screen: NEGATIVE

## 2023-02-16 MED ORDER — AZITHROMYCIN 250 MG PO TABS: ORAL_TABLET | ORAL | 0 refills | Status: AC

## 2023-04-19 ENCOUNTER — Encounter: Payer: Self-pay | Admitting: Physician Assistant

## 2023-04-19 ENCOUNTER — Ambulatory Visit (INDEPENDENT_AMBULATORY_CARE_PROVIDER_SITE_OTHER): Payer: 59 | Admitting: Physician Assistant

## 2023-04-19 VITALS — BP 120/80 | HR 82 | Temp 98.2°F | Ht 64.0 in | Wt 146.0 lb

## 2023-04-19 DIAGNOSIS — R059 Cough, unspecified: Secondary | ICD-10-CM | POA: Diagnosis not present

## 2023-04-19 LAB — POC COVID19 BINAXNOW: SARS Coronavirus 2 Ag: NEGATIVE

## 2023-04-20 NOTE — Progress Notes (Signed)
Patient was unable to stay for appointment - she was not seen by me. No charge for visit.

## 2023-04-21 ENCOUNTER — Encounter: Payer: Self-pay | Admitting: Physician Assistant

## 2023-04-21 ENCOUNTER — Ambulatory Visit (INDEPENDENT_AMBULATORY_CARE_PROVIDER_SITE_OTHER): Payer: 59 | Admitting: Physician Assistant

## 2023-04-21 VITALS — BP 120/80 | HR 76 | Temp 97.9°F | Ht 64.0 in | Wt 147.0 lb

## 2023-04-21 DIAGNOSIS — R059 Cough, unspecified: Secondary | ICD-10-CM

## 2023-04-21 LAB — POCT INFLUENZA A/B
Influenza A, POC: NEGATIVE
Influenza B, POC: NEGATIVE

## 2023-04-21 MED ORDER — PREDNISONE 20 MG PO TABS: 20.0000 mg | ORAL_TABLET | Freq: Two times a day (BID) | ORAL | 0 refills | Status: DC

## 2023-04-21 NOTE — Progress Notes (Signed)
Amber Hendricks is a 48 y.o. female here for a new problem.  History of Present Illness:   Chief Complaint  Patient presents with   Cough    Pt c/o cough and chest congestion, fatigue since Saturday, worse at night.    Cough  she complains of a cough that has persisted for the past few days. Associated symptoms include chest congestion/ tightness, fatigue, and chills on Sunday. Her symptoms tend to be worse at night and by talking.  she was previously diagnosed with pneumonia which was treated with a Z-pak. She's unsure if the cough still lingers from that. She has been taking mucinex without much relief.  she denies any sore throat, ear pain, nasal congestion, or recent travel Covid test was negative She's currently not taking singular or an antihistamine regularly  Past Medical History:  Diagnosis Date   Allergy    Anxiety    Arthritis    Chronic tension headaches    /cervicogenic HAs- Dr Terrace Arabia consults 3/15   Cysts of both ovaries    Former smoker    Frequent headaches    History of chicken pox    History of migraine headaches    Menstual- no aura   Hyperlipidemia    Migraines    UTI (urinary tract infection)      Social History   Tobacco Use   Smoking status: Former    Current packs/day: 0.00    Types: Cigarettes    Quit date: 09/12/2009    Years since quitting: 13.6   Smokeless tobacco: Never   Tobacco comments:    Quit five years ago.  Vaping Use   Vaping status: Never Used  Substance Use Topics   Alcohol use: No    Comment: occasionally   Drug use: No    Past Surgical History:  Procedure Laterality Date   WISDOM TOOTH EXTRACTION      Family History  Problem Relation Age of Onset   Osteoporosis Mother    Hyperlipidemia Mother    Osteoarthritis Father        (neck and shoulders)   Heart attack Father    Hypertension Father    Arthritis Father    Parkinson's disease Father    Lung disease Maternal Grandmother        non-smoker; required oxygen    Hearing loss Maternal Grandmother    Hypertension Maternal Grandmother    Pulmonary fibrosis Maternal Grandmother    Diabetes Maternal Grandfather    Heart attack Maternal Grandfather 40   Dementia Paternal Grandmother    Hearing loss Paternal Grandmother    Heart disease Paternal Grandfather    Early death Paternal Grandfather    Heart attack Paternal Grandfather    Prostate cancer Maternal Uncle     No Known Allergies  Current Medications:   Current Outpatient Medications:    acetaminophen (TYLENOL) 500 MG tablet, Take 500 mg by mouth every 6 (six) hours as needed for headache., Disp: , Rfl:    ibuprofen (ADVIL) 800 MG tablet, Take 800 mg by mouth every 8 (eight) hours as needed., Disp: , Rfl:    Levonorgestrel-Ethinyl Estradiol (AMETHIA) 0.1-0.02 & 0.01 MG tablet, Take 1 tablet by mouth daily., Disp: , Rfl:    meloxicam (MOBIC) 15 MG tablet, Take 15 mg by mouth as needed., Disp: , Rfl:    methocarbamol (ROBAXIN) 500 MG tablet, Take 500 mg by mouth as needed., Disp: , Rfl:    montelukast (SINGULAIR) 10 MG tablet, TAKE 1 TABLET BY  MOUTH EVERYDAY AT BEDTIME, Disp: 90 tablet, Rfl: 1   Omega-3 Fatty Acids (FISH OIL) 1000 MG CAPS, Take 1 capsule by mouth daily in the afternoon., Disp: , Rfl:    predniSONE (DELTASONE) 20 MG tablet, Take 1 tablet (20 mg total) by mouth 2 (two) times daily with a meal., Disp: 10 tablet, Rfl: 0   Turmeric 500 MG CAPS, Take 1 capsule by mouth daily in the afternoon., Disp: , Rfl:    Review of Systems:   Review of Systems  Constitutional:  Positive for malaise/fatigue.  HENT:  Negative for congestion, ear pain and sore throat.   Respiratory:  Positive for cough.        +chest tightness  Negative unless otherwise specified per HPI.  Vitals:   Vitals:   04/21/23 1100  BP: 120/80  Pulse: 76  Temp: 97.9 F (36.6 C)  TempSrc: Temporal  SpO2: 98%  Weight: 147 lb (66.7 kg)  Height: 5\' 4"  (1.626 m)     Body mass index is 25.23 kg/m.  Physical  Exam:   Physical Exam Vitals and nursing note reviewed.  Constitutional:      General: She is not in acute distress.    Appearance: She is well-developed. She is not ill-appearing or toxic-appearing.  HENT:     Head: Normocephalic and atraumatic.     Right Ear: Tympanic membrane, ear canal and external ear normal. Tympanic membrane is not erythematous, retracted or bulging.     Left Ear: Tympanic membrane, ear canal and external ear normal. Tympanic membrane is not erythematous, retracted or bulging.     Nose: Nose normal.     Right Sinus: No maxillary sinus tenderness or frontal sinus tenderness.     Left Sinus: No maxillary sinus tenderness or frontal sinus tenderness.     Mouth/Throat:     Pharynx: Uvula midline. No posterior oropharyngeal erythema.  Eyes:     General: Lids are normal.     Conjunctiva/sclera: Conjunctivae normal.  Neck:     Trachea: Trachea normal.  Cardiovascular:     Rate and Rhythm: Normal rate and regular rhythm.     Heart sounds: Normal heart sounds, S1 normal and S2 normal.  Pulmonary:     Effort: Pulmonary effort is normal.     Breath sounds: Normal breath sounds. No decreased breath sounds, wheezing, rhonchi or rales.  Lymphadenopathy:     Cervical: No cervical adenopathy.  Skin:    General: Skin is warm and dry.  Neurological:     Mental Status: She is alert.  Psychiatric:        Speech: Speech normal.        Behavior: Behavior normal. Behavior is cooperative.    Results for orders placed or performed in visit on 04/21/23  POCT Influenza A/B  Result Value Ref Range   Influenza A, POC Negative Negative   Influenza B, POC Negative Negative     Assessment and Plan:   Cough, unspecified type No red flags on exam.   Suspect viral uri Will initiate prednisone per orders.  Discussed taking medications as prescribed.  Reviewed return precautions including new or worsening fever, SOB, new or worsening cough or other concerns.  Push fluids and  rest.  I recommend that patient follow-up if symptoms worsen or persist despite treatment x 7-10 days, sooner if needed.   Jarold Motto, PA-C  I,Safa M Kadhim,acting as a scribe for Jarold Motto, PA.,have documented all relevant documentation on the behalf of Jarold Motto, PA,as directed  by  Jarold Motto, PA while in the presence of [place], [place].   I, Jarold Motto, [place], have reviewed all documentation for this visit. The documentation on 04/21/23 for the exam, diagnosis, procedures, and orders are all accurate and complete.

## 2023-04-21 NOTE — Patient Instructions (Signed)
It was great to see you!  We will swab for flu and let you know result  Start oral prednisone 40 mg daily  Keep me posted on symptom(s)   Take care,  Jarold Motto PA-C

## 2023-07-14 ENCOUNTER — Encounter: Admitting: Physician Assistant

## 2023-11-01 ENCOUNTER — Ambulatory Visit: Payer: Self-pay

## 2023-11-01 ENCOUNTER — Emergency Department (HOSPITAL_BASED_OUTPATIENT_CLINIC_OR_DEPARTMENT_OTHER): Admitting: Radiology

## 2023-11-01 ENCOUNTER — Encounter (HOSPITAL_BASED_OUTPATIENT_CLINIC_OR_DEPARTMENT_OTHER): Payer: Self-pay | Admitting: Emergency Medicine

## 2023-11-01 ENCOUNTER — Other Ambulatory Visit: Payer: Self-pay

## 2023-11-01 ENCOUNTER — Emergency Department (HOSPITAL_BASED_OUTPATIENT_CLINIC_OR_DEPARTMENT_OTHER)
Admission: EM | Admit: 2023-11-01 | Discharge: 2023-11-01 | Disposition: A | Discharge status: Home / Self Care | Attending: Emergency Medicine | Admitting: Emergency Medicine

## 2023-11-01 DIAGNOSIS — M5412 Radiculopathy, cervical region: Secondary | ICD-10-CM | POA: Diagnosis not present

## 2023-11-01 DIAGNOSIS — M542 Cervicalgia: Secondary | ICD-10-CM | POA: Diagnosis present

## 2023-11-01 LAB — PREGNANCY, URINE: Preg Test, Ur: NEGATIVE

## 2023-11-01 LAB — BASIC METABOLIC PANEL WITH GFR
Anion gap: 13 (ref 5–15)
BUN: 9 mg/dL (ref 6–20)
CO2: 22 mmol/L (ref 22–32)
Calcium: 9.8 mg/dL (ref 8.9–10.3)
Chloride: 103 mmol/L (ref 98–111)
Creatinine, Ser: 0.81 mg/dL (ref 0.44–1.00)
GFR, Estimated: >60 mL/min (ref >60)
Glucose, Bld: 139 mg/dL — ABNORMAL HIGH (ref 70–99)
Potassium: 4.3 mmol/L (ref 3.5–5.1)
Sodium: 138 mmol/L (ref 135–145)

## 2023-11-01 LAB — TROPONIN T, HIGH SENSITIVITY: Troponin T High Sensitivity: <15 ng/L (ref <19)

## 2023-11-01 LAB — CBC
HCT: 42 % (ref 36.0–46.0)
Hemoglobin: 14 g/dL (ref 12.0–15.0)
MCH: 30.4 pg (ref 26.0–34.0)
MCHC: 33.3 g/dL (ref 30.0–36.0)
MCV: 91.3 fL (ref 80.0–100.0)
Platelets: 272 K/uL (ref 150–400)
RBC: 4.6 MIL/uL (ref 3.87–5.11)
RDW: 12.8 % (ref 11.5–15.5)
WBC: 6.5 K/uL (ref 4.0–10.5)
nRBC: 0 % (ref 0.0–0.2)

## 2023-11-01 MED ORDER — GABAPENTIN 100 MG PO CAPS: 100.0000 mg | ORAL_CAPSULE | Freq: Two times a day (BID) | ORAL | 0 refills | Status: AC

## 2023-11-01 NOTE — ED Triage Notes (Signed)
 Pt caox4, ambulatory c/o CP, pain and numbness down L arm x1 wk, L jaw pain x5 days, worse at night.

## 2023-11-01 NOTE — Telephone Encounter (Signed)
 FYI Only or Action Required?: FYI only for provider.  Patient was last seen in primary care on 04/21/2023 by Job Lukes, PA.  Called Nurse Triage reporting Neck Pain.  Symptoms began several weeks ago.  Interventions attempted: OTC medications: Icy hot - massage.  Symptoms are: unchanged.  Triage Disposition: See Physician Within 24 Hours  Patient/caregiver understands and will follow disposition?: Unsure - pt may go to ED for evaluation of heart.              Copied from CRM #[id number]. Topic: Clinical - Red Word Triage >> Nov 01, 2023  9:53 AM [place] J wrote: [facility] that prompted transfer to Nurse Triage: Patient is having neck pain that leads down to her shoulder. Reason for Disposition  Numbness in an arm or hand (i.e., loss of sensation)  Answer Assessment - Initial Assessment Questions 1. ONSET: When did the pain begin?      2 weeks 2. LOCATION: Where does it hurt?      Left side and goes into your jaw 3. PATTERN Does the pain come and go, or has it been constant since it started?      Steady - worse at night 4. SEVERITY: How bad is the pain?  (Scale 0-10; or none or slight stiffness, mild, moderate, severe)     Annoying - positional pain 5. RADIATION: Does the pain go anywhere else, shoot into your arms?     Jaw 6. CORD SYMPTOMS: Any weakness or numbness of the arms or legs?     Yes - left arm - now has gone away 7. CAUSE: What do you think is causing the neck pain?     Lifted some dumbbells 8. NECK OVERUSE: Any recent activities that involved turning or twisting the neck?     no 9. OTHER SYMPTOMS: Do you have any other symptoms? (e.g., headache, fever, chest pain, difficulty breathing, neck swelling)     Chest pain - under her arm  Protocols used: Neck Pain or Stiffness-A-AH

## 2023-11-01 NOTE — Telephone Encounter (Signed)
 Noted

## 2023-11-01 NOTE — Discharge Instructions (Addendum)
 Restart your gabapentin  as we discussed.  Follow-up with the spine as surgeon  Your workup today did not show any acute findings.

## 2023-11-01 NOTE — ED Provider Notes (Signed)
 [place] EMERGENCY DEPARTMENT AT [facility] Provider Note   CSN: [id number] Arrival date & time: 11/01/23  1041     Patient presents with: No chief complaint on file.   [name] is a 48 y.o. female.   48 year old female presents with left-sided neck pain which is going on for quite some time.  Recently started having some intermittent left jaw pain as well.  Patient states she has known cervical disc disease.  States that the pain is worse at night when she tries to go to bed.  No cardiac symptomatology.  No dyspnea or diaphoresis.  No nausea or vomiting.  No rashes noted.  No exertional component.  Pain starts at her left lateral neck goes into her trapezius distribution.  Denies any weakness to her left hand.  Has used gabapentin  in the past which has helped       Prior to Admission medications   Medication Sig Start Date End Date Taking? Authorizing Provider  acetaminophen  (TYLENOL ) 500 MG tablet Take 500 mg by mouth every 6 (six) hours as needed for headache.    [provider]  ibuprofen  (ADVIL ) 800 MG tablet Take 800 mg by mouth every 8 (eight) hours as needed.    [provider]  Levonorgestrel-Ethinyl Estradiol (AMETHIA) 0.1-0.02 & 0.01 MG tablet Take 1 tablet by mouth daily. 12/21/22   [provider]  meloxicam (MOBIC) 15 MG tablet Take 15 mg by mouth as needed. 02/01/23   [provider]  methocarbamol (ROBAXIN) 500 MG tablet Take 500 mg by mouth as needed. 02/01/23   [provider]  montelukast  (SINGULAIR ) 10 MG tablet TAKE 1 TABLET BY MOUTH EVERYDAY AT BEDTIME 01/06/22   Job Lukes, PA  Omega-3 Fatty Acids (FISH OIL) 1000 MG CAPS Take 1 capsule by mouth daily in the afternoon.    [provider]  predniSONE  (DELTASONE ) 20 MG tablet Take 1 tablet (20 mg total) by mouth 2 (two) times daily with a meal. 04/21/23   Job Lukes, PA  Turmeric 500 MG CAPS Take 1 capsule by mouth daily in the afternoon.     [provider]    Allergies: Patient has no known allergies.    Review of Systems  All other systems reviewed and are negative.   Updated Vital Signs BP (!) 161/93 (BP Location: Right Arm)   Pulse 93   Temp 98.8 F (37.1 C) (Oral)   Resp 18   Ht 1.626 m (5' 4)   Wt 63.5 kg   LMP 10/04/2023 (Approximate)   SpO2 96%   BMI 24.03 kg/m   Physical Exam Vitals and nursing note reviewed.  Constitutional:      General: She is not in acute distress.    Appearance: Normal appearance. She is well-developed. She is not toxic-appearing.  HENT:     Head: Normocephalic and atraumatic.  Eyes:     General: Lids are normal.     Conjunctiva/sclera: Conjunctivae normal.     Pupils: Pupils are equal, round, and reactive to light.  Neck:     Thyroid: No thyroid mass.     Trachea: No tracheal deviation.   Cardiovascular:     Rate and Rhythm: Normal rate and regular rhythm.     Heart sounds: Normal heart sounds. No murmur heard.    No gallop.  Pulmonary:     Effort: Pulmonary effort is normal. No respiratory distress.     Breath sounds: Normal breath sounds. No stridor. No decreased breath sounds,  wheezing, rhonchi or rales.  Abdominal:     General: There is no distension.     Palpations: Abdomen is soft.     Tenderness: There is no abdominal tenderness. There is no rebound.  Musculoskeletal:        General: No tenderness. Normal range of motion.     Cervical back: Normal range of motion and neck supple. Muscular tenderness present. No spinous process tenderness.  Skin:    General: Skin is warm and dry.     Findings: No abrasion or rash.  Neurological:     Mental Status: She is alert and oriented to person, place, and time. Mental status is at baseline.     GCS: GCS eye subscore is 4. GCS verbal subscore is 5. GCS motor subscore is 6.     Cranial Nerves: No cranial nerve deficit.     Sensory: No sensory deficit.     Motor: Motor function is intact.  Psychiatric:         Attention and Perception: Attention normal.        Speech: Speech normal.        Behavior: Behavior normal.     (all labs ordered are listed, but only abnormal results are displayed) Labs Reviewed  CBC  PREGNANCY, URINE  BASIC METABOLIC PANEL WITH GFR  TROPONIN T, HIGH SENSITIVITY    EKG: EKG Interpretation Date/Time:  Monday November 01 2023 10:48:01 EDT Ventricular Rate:  89 PR Interval:  136 QRS Duration:  82 QT Interval:  360 QTC Calculation: 438 R Axis:   67  Text Interpretation: Normal sinus rhythm with sinus arrhythmia Possible Left atrial enlargement Borderline ECG No previous ECGs available Confirmed by Dasie Faden ([id number]) on 11/01/2023 10:52:56 AM  Radiology: No results found.   Procedures   Medications Ordered in the ED - No data to display                                  Medical Decision Making Amount and/or Complexity of Data Reviewed Labs: ordered. Radiology: ordered.   Patient is EKG interpretation shows normal sinus rhythm rate of 89.  Patient with reproducible musculoskeletal pain along trapezius distribution on the left side.  Patient's heart score is 1.  Pain has been going on for several weeks.  Suspect cervical nerve impingement.  Will have patient restart her gabapentin  and recontact her spine surgeon     Final diagnoses:  None    ED Discharge Orders     None          Dasie Faden, MD 11/01/23 1136

## 2023-11-03 ENCOUNTER — Ambulatory Visit: Admitting: Physician Assistant

## 2023-12-27 ENCOUNTER — Ambulatory Visit: Admitting: Physician Assistant

## 2023-12-27 ENCOUNTER — Other Ambulatory Visit (HOSPITAL_COMMUNITY)
Admission: RE | Admit: 2023-12-27 | Discharge: 2023-12-27 | Disposition: A | Discharge status: Home / Self Care | Source: Ambulatory Visit | Attending: Physician Assistant | Admitting: Physician Assistant

## 2023-12-27 ENCOUNTER — Encounter: Payer: Self-pay | Admitting: Physician Assistant

## 2023-12-27 ENCOUNTER — Ambulatory Visit: Payer: Self-pay

## 2023-12-27 VITALS — BP 100/64 | HR 73 | Temp 97.9°F | Ht 64.0 in | Wt 146.5 lb

## 2023-12-27 DIAGNOSIS — R300 Dysuria: Secondary | ICD-10-CM | POA: Insufficient documentation

## 2023-12-27 DIAGNOSIS — R1030 Lower abdominal pain, unspecified: Secondary | ICD-10-CM | POA: Diagnosis not present

## 2023-12-27 LAB — POCT URINALYSIS DIPSTICK
Bilirubin, UA: NEGATIVE
Blood, UA: POSITIVE
Glucose, UA: NEGATIVE
Ketones, UA: NEGATIVE
Nitrite, UA: NEGATIVE
Protein, UA: NEGATIVE
Spec Grav, UA: 1.015 (ref 1.010–1.025)
Urobilinogen, UA: 0.2 U/dL
pH, UA: 6 (ref 5.0–8.0)

## 2023-12-27 MED ORDER — CEPHALEXIN 500 MG PO CAPS: 500.0000 mg | ORAL_CAPSULE | Freq: Two times a day (BID) | ORAL | 0 refills | Status: AC

## 2023-12-27 NOTE — Telephone Encounter (Signed)
 FYI Only or Action Required?: FYI only for provider.  Patient was last seen in primary care on 04/21/2023 by Job Lukes, PA.  Called Nurse Triage reporting Back Pain and Dysuria.  Symptoms began several days ago.  Interventions attempted: OTC medications: cranberry pills.  Symptoms are: gradually worsening.  Triage Disposition: See Physician Within 24 Hours  Patient/caregiver understands and will follow disposition?: Yes  Copied from CRM #[id number]. Topic: Clinical - Red Word Triage >> Dec 27, 2023  8:21 AM Henretta I wrote: Red Word that prompted transfer to Nurse Triage: lower back pain and pain in front, concerned of possible uti or kidney infenction. Little burn when peeing >> Dec 27, 2023  8:26 AM Henretta I wrote: Left abdominal pain, patient would just like a call back from nurse Reason for Disposition  All other patients with painful urination  (Exception: [1] EITHER frequency or urgency AND [2] has on-call doctor.)  Answer Assessment - Initial Assessment Questions 1. SEVERITY: How bad is the pain?  (e.g., Scale 1-10; mild, moderate, or severe)     Mild burning with urination, constant low back and lower abdominal pain  3. PATTERN: Is pain present every time you urinate or just sometimes?      Does not burn every time, but is uncomfortable 4. ONSET: When did the painful urination start?      Four days ago 5. FEVER: Do you have a fever? If Yes, ask: What is your temperature, how was it measured, and when did it start?     denies 6. PAST UTI: Have you had a urine infection before? If Yes, ask: When was the last time? and What happened that time?      Positive for hx of UTI, but last was a long time ago 7. CAUSE: What do you think is causing the painful urination?  (e.g., UTI, scratch, Herpes sore)     Possible UTI 8. OTHER SYMPTOMS: Do you have any other symptoms? (e.g., blood in urine, flank pain, genital sores, urgency, vaginal discharge)     Low back  and lower abdominal pain 9. PREGNANCY: Is there any chance you are pregnant? When was your last menstrual period?     12/22/2023  Protocols used: Urination Pain - Female-A-AH

## 2023-12-27 NOTE — Telephone Encounter (Signed)
 Appt today

## 2023-12-27 NOTE — Patient Instructions (Signed)
 It was great to see you!  Push fluids  Work on constipation  We will check blood work and vaginal swab today  If worsening abdominal pain that goes to the RIGHT side -- go to the ER  If worsening left lower quadrant abdominal pain, we can order stat CT abdomen/pelvis tomorrow  If burning returns -- start cephalexin antibiotic(s) sent in  Take care,  Lucie Buttner PA-C

## 2023-12-27 NOTE — Progress Notes (Signed)
 Amber Hendricks is a 48 y.o. female here for a follow up of a pre-existing problem.  History of Present Illness:   Chief Complaint  Patient presents with   Abdominal Pain    Pt c/o abdominal pain, low back pain and some burning with urination, symptoms started on Thursday.    Discussed the use of AI scribe software for clinical note transcription with the patient, who gave verbal consent to proceed.  History of Present Illness   Amber Hendricks is a 48 year old female who presents with lower abdominal pain and urinary symptoms.  She experiences burning sensations in the vaginal area following her menstrual period, initially relieved by a cranberry pill and increased water intake. She develops lower back pain, attributed to physical activity, and crampy pain similar to menstrual cramps with some vaginal bleeding. The cramping and lower back pain persist, particularly on the left side, with minimal relief from a heating pad and ibuprofen .  The burning sensation differs from past UTIs, being less severe and not involving frequent urination. She has a history of UTIs with intense burning and frequent urination in her younger years. She also has a history of ovarian cysts, which have resolved without intervention, and has not had recent abdominal imaging.  She takes quarterly period pills for migraine management, experiencing irregular bleeding. Her last menstrual period ended on Thursday, with some bleeding on Saturday morning. Today, her urine shows trace blood and bacteria. She reports no unusual vaginal discharge or recent sexual activity and experiences occasional constipation.         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  Vaping Use   Vaping status: Never Used  Substance Use Topics   Alcohol use: No    Comment: occasionally   Drug use: No    Past Surgical History:  Procedure Laterality Date   WISDOM TOOTH EXTRACTION      Family History  Problem Relation Age of Onset   Osteoporosis Mother    Hyperlipidemia Mother    Osteoarthritis Father        (neck and shoulders)   Heart attack Father    Hypertension Father    Arthritis Father    Parkinson's disease Father    Lung disease Maternal Grandmother        non-smoker; required oxygen   Hearing loss Maternal Grandmother    Hypertension Maternal Grandmother    Pulmonary fibrosis Maternal Grandmother    Diabetes Maternal Grandfather    Heart attack Maternal Grandfather 49   Dementia Paternal Grandmother    Hearing loss Paternal Grandmother    Heart disease Paternal Grandfather    Early death Paternal Grandfather    Heart attack Paternal Grandfather    Prostate cancer  Maternal Uncle     No Known Allergies  Current Medications:   Current Outpatient Medications:    acetaminophen  (TYLENOL ) 500 MG tablet, Take 500 mg by mouth every 6 (six) hours as needed for headache., Disp: , Rfl:    gabapentin  (NEURONTIN ) 100 MG capsule, Take 1 capsule (100 mg total) by mouth 2 (two) times daily. (Patient taking differently: Take 100 mg by mouth daily in the afternoon.), Disp: 60 capsule, Rfl: 0   ibuprofen  (ADVIL ) 800 MG tablet, Take 800 mg by mouth every 8 (eight) hours as needed., Disp: , Rfl:    Levonorgestrel-Ethinyl Estradiol (AMETHIA) 0.1-0.02 & 0.01 MG tablet, Take 1 tablet by mouth daily., Disp: , Rfl:    Multiple Vitamin (MULTIVITAMIN) tablet, Take 1 tablet by mouth  daily., Disp: , Rfl:    Review of Systems:   Negative unless otherwise specified per HPI.  Vitals:   Vitals:   12/27/23 1539  BP: 100/64  Pulse: 73  Temp: 97.9 F (36.6 C)  TempSrc: Temporal  SpO2: 98%  Weight: 146 lb 8 oz (66.5 kg)  Height: 5' 4 (1.626 m)     Body mass index is 25.15 kg/m.  Physical Exam:   Physical Exam Vitals and nursing note reviewed. Exam conducted with a chaperone present.  Constitutional:      General: She is not in acute distress.    Appearance: She is well-developed. She is not ill-appearing or toxic-appearing.  Cardiovascular:     Rate and Rhythm: Normal rate and regular rhythm.     Pulses: Normal pulses.     Heart sounds: Normal heart sounds, S1 normal and S2 normal.  Pulmonary:     Effort: Pulmonary effort is normal.     Breath sounds: Normal breath sounds.  Genitourinary:    Labia:        Right: No rash or tenderness.        Left: No rash or tenderness.      Vagina: Normal.     Cervix: Normal. No cervical motion tenderness.     Uterus: Normal.   Skin:    General: Skin is warm and dry.  Neurological:     Mental Status: She is alert.     GCS: GCS eye subscore is 4. GCS verbal subscore is 5. GCS motor subscore is 6.  Psychiatric:        Speech: Speech normal.        Behavior: Behavior normal. Behavior is cooperative.     Assessment and Plan:   Assessment and Plan    Lower abdominal pain; Dysuria Intermittent lower abdominal pain with dysuria and irregular bleeding post-menstruation. Differential includes UTI, ovarian cysts, perimenopausal changes, constipation, diverticulitis, kidney stones. Urinalysis suggests possible UTI or stone. - Pelvic exam completed -- no red flags -  Blood work completed - Monitor symptoms; consider imaging if symptoms worsen or persist. - Discussed antibiotics for UTI; deferred due to atypical presentation. - recommend working on constipation as well         Lucie Buttner, PA-C

## 2023-12-28 ENCOUNTER — Ambulatory Visit: Payer: Self-pay | Admitting: Physician Assistant

## 2023-12-28 LAB — URINE CULTURE
MICRO NUMBER:: 17061039
Result:: NO GROWTH
SPECIMEN QUALITY:: ADEQUATE

## 2023-12-28 LAB — CBC WITH DIFFERENTIAL/PLATELET
Basophils Absolute: 0.1 K/uL (ref 0.0–0.1)
Basophils Relative: 1.1 % (ref 0.0–3.0)
Eosinophils Absolute: 0.1 K/uL (ref 0.0–0.7)
Eosinophils Relative: 1.9 % (ref 0.0–5.0)
HCT: 40.8 % (ref 36.0–46.0)
Hemoglobin: 13.7 g/dL (ref 12.0–15.0)
Lymphocytes Relative: 27.4 % (ref 12.0–46.0)
Lymphs Abs: 2.1 K/uL (ref 0.7–4.0)
MCHC: 33.7 g/dL (ref 30.0–36.0)
MCV: 89.7 fl (ref 78.0–100.0)
Monocytes Absolute: 0.4 K/uL (ref 0.1–1.0)
Monocytes Relative: 5.8 % (ref 3.0–12.0)
Neutro Abs: 4.8 K/uL (ref 1.4–7.7)
Neutrophils Relative %: 63.8 % (ref 43.0–77.0)
Platelets: 291 K/uL (ref 150.0–400.0)
RBC: 4.55 Mil/uL (ref 3.87–5.11)
RDW: 12.9 % (ref 11.5–15.5)
WBC: 7.5 K/uL (ref 4.0–10.5)

## 2023-12-28 LAB — COMPREHENSIVE METABOLIC PANEL WITH GFR
ALT: 21 U/L (ref 0–35)
AST: 18 U/L (ref 0–37)
Albumin: 4.7 g/dL (ref 3.5–5.2)
Alkaline Phosphatase: 49 U/L (ref 39–117)
BUN: 12 mg/dL (ref 6–23)
CO2: 28 meq/L (ref 19–32)
Calcium: 9.8 mg/dL (ref 8.4–10.5)
Chloride: 103 meq/L (ref 96–112)
Creatinine, Ser: 0.7 mg/dL (ref 0.40–1.20)
GFR: 103.84 mL/min (ref >60.00)
Glucose, Bld: 95 mg/dL (ref 70–99)
Potassium: 4.4 meq/L (ref 3.5–5.1)
Sodium: 140 meq/L (ref 135–145)
Total Bilirubin: 0.4 mg/dL (ref 0.2–1.2)
Total Protein: 7.1 g/dL (ref 6.0–8.3)

## 2023-12-29 LAB — CERVICOVAGINAL ANCILLARY ONLY
Bacterial Vaginitis (gardnerella): NEGATIVE
Candida Glabrata: NEGATIVE
Candida Vaginitis: NEGATIVE
Comment: NEGATIVE
Comment: NEGATIVE
Comment: NEGATIVE

## 2024-03-01 ENCOUNTER — Emergency Department (HOSPITAL_BASED_OUTPATIENT_CLINIC_OR_DEPARTMENT_OTHER)
Admission: EM | Admit: 2024-03-01 | Discharge: 2024-03-01 | Disposition: A | Discharge status: Home / Self Care | Attending: Emergency Medicine | Admitting: Emergency Medicine

## 2024-03-01 ENCOUNTER — Other Ambulatory Visit: Payer: Self-pay

## 2024-03-01 ENCOUNTER — Encounter (HOSPITAL_BASED_OUTPATIENT_CLINIC_OR_DEPARTMENT_OTHER): Payer: Self-pay | Admitting: Emergency Medicine

## 2024-03-01 ENCOUNTER — Emergency Department (HOSPITAL_BASED_OUTPATIENT_CLINIC_OR_DEPARTMENT_OTHER): Admitting: Radiology

## 2024-03-01 DIAGNOSIS — R0789 Other chest pain: Secondary | ICD-10-CM | POA: Insufficient documentation

## 2024-03-01 DIAGNOSIS — R059 Cough, unspecified: Secondary | ICD-10-CM | POA: Diagnosis not present

## 2024-03-01 DIAGNOSIS — R079 Chest pain, unspecified: Secondary | ICD-10-CM

## 2024-03-01 LAB — CBC
HCT: 39.9 % (ref 36.0–46.0)
Hemoglobin: 13.7 g/dL (ref 12.0–15.0)
MCH: 31 pg (ref 26.0–34.0)
MCHC: 34.3 g/dL (ref 30.0–36.0)
MCV: 90.3 fL (ref 80.0–100.0)
Platelets: 355 K/uL (ref 150–400)
RBC: 4.42 MIL/uL (ref 3.87–5.11)
RDW: 12.5 % (ref 11.5–15.5)
WBC: 11.4 K/uL — ABNORMAL HIGH (ref 4.0–10.5)
nRBC: 0 % (ref 0.0–0.2)

## 2024-03-01 LAB — BASIC METABOLIC PANEL WITH GFR
Anion gap: 12 (ref 5–15)
BUN: 13 mg/dL (ref 6–20)
CO2: 25 mmol/L (ref 22–32)
Calcium: 9.8 mg/dL (ref 8.9–10.3)
Chloride: 101 mmol/L (ref 98–111)
Creatinine, Ser: 0.69 mg/dL (ref 0.44–1.00)
GFR, Estimated: >60 mL/min (ref >60)
Glucose, Bld: 114 mg/dL — ABNORMAL HIGH (ref 70–99)
Potassium: 4.1 mmol/L (ref 3.5–5.1)
Sodium: 138 mmol/L (ref 135–145)

## 2024-03-01 LAB — D-DIMER, QUANTITATIVE: D-Dimer, Quant: 0.33 ug{FEU}/mL (ref 0.00–0.50)

## 2024-03-01 LAB — TROPONIN T, HIGH SENSITIVITY
Troponin T High Sensitivity: <15 ng/L (ref 0–19)
Troponin T High Sensitivity: <15 ng/L (ref 0–19)

## 2024-03-01 LAB — PREGNANCY, URINE: Preg Test, Ur: NEGATIVE

## 2024-03-01 MED ORDER — ONDANSETRON HCL 4 MG/2ML IJ SOLN
4.0000 mg | Freq: Once | INTRAMUSCULAR | Status: AC
  Administered 2024-03-01: 4 mg via INTRAVENOUS
  Filled 2024-03-01: qty 2

## 2024-03-01 MED ORDER — KETOROLAC TROMETHAMINE 15 MG/ML IJ SOLN
15.0000 mg | Freq: Once | INTRAMUSCULAR | Status: AC
  Administered 2024-03-01: 15 mg via INTRAVENOUS
  Filled 2024-03-01: qty 1

## 2024-03-01 MED ORDER — ACETAMINOPHEN 500 MG PO TABS
1000.0000 mg | ORAL_TABLET | Freq: Once | ORAL | Status: AC
  Administered 2024-03-01: 1000 mg via ORAL
  Filled 2024-03-01: qty 2

## 2024-03-01 MED ORDER — MORPHINE SULFATE (PF) 4 MG/ML IV SOLN
4.0000 mg | Freq: Once | INTRAVENOUS | Status: AC
  Administered 2024-03-01: 4 mg via INTRAVENOUS
  Filled 2024-03-01: qty 1

## 2024-03-01 NOTE — Discharge Instructions (Signed)
 As we discussed please return for sudden worsening chest pain if you cough up blood or if you feel you are in a pass out.  Also return if your pain seems to be worse when you are exercising or going up stairs.  Let your family doctor know about your visit and see when they want to see you in clinic. Take 4 over the counter ibuprofen  tablets 3 times a day or 2 over-the-counter naproxen tablets twice a day for pain. Also take tylenol  1000mg (2 extra strength) four times a day.

## 2024-03-01 NOTE — ED Provider Notes (Signed)
 [place] EMERGENCY DEPARTMENT AT [facility] Provider Note   CSN: [id number] Arrival date & time: 03/01/24  1605     Patient presents with: Chest Pain   Amber Hendricks is a 48 y.o. female.   48 yo F with a chief complaint of left-sided chest discomfort.  Been going on for about 4 days.  Felt he got a bit worse today.  Has developed a bit of a cough and that seems to have made it worse.  Worse with palpation and twisting lying on that side.  Deep breathing.  She had been seen in the ED for chest pain previously it was thought to be radicular neck pain.  Has had trouble with her neck had a recent injection.  Also had recent dental work.  Patient denies history of MI, denies hypertension  diabetes or smoking.  Denies Father had MI maybe in his 40's  Patient denies history of PE or DVT denies hemoptysis denies unilateral lower extremity edema denies recent surgery immobilization hospitalization  use or history of cancer.  She does use topical estrogen.  She had recent neck injection and dental extraction.       Chest Pain      Prior to Admission medications   Medication Sig Start Date End Date Taking? Authorizing Provider  acetaminophen  (TYLENOL ) 500 MG tablet Take 500 mg by mouth every 6 (six) hours as needed for headache.    [provider]  cephALEXin  (KEFLEX ) 500 MG capsule Take 1 capsule (500 mg total) by mouth 2 (two) times daily. 12/27/23   Job Lukes, PA  gabapentin  (NEURONTIN ) 100 MG capsule Take 1 capsule (100 mg total) by mouth 2 (two) times daily. Patient taking differently: Take 100 mg by mouth daily in the afternoon. 11/01/23   Dasie Faden, MD  ibuprofen  (ADVIL ) 800 MG tablet Take 800 mg by mouth every 8 (eight) hours as needed.    [provider]  Levonorgestrel-Ethinyl Estradiol (AMETHIA) 0.1-0.02 & 0.01 MG tablet Take 1 tablet by mouth daily. 12/21/22   [provider]  Multiple Vitamin (MULTIVITAMIN) tablet Take 1 tablet  by mouth daily.    [provider]    Allergies: Patient has no known allergies.    Review of Systems  Cardiovascular:  Positive for chest pain.    Updated Vital Signs BP 128/79   Pulse 72   Temp 98.5 F (36.9 C)   Resp 18   SpO2 100%   Physical Exam Vitals and nursing note reviewed.  Constitutional:      General: She is not in acute distress.    Appearance: She is well-developed. She is not diaphoretic.  HENT:     Head: Normocephalic and atraumatic.  Eyes:     Pupils: Pupils are equal, round, and reactive to light.  Cardiovascular:     Rate and Rhythm: Normal rate and regular rhythm.     Heart sounds: No murmur heard.    No friction rub. No gallop.  Pulmonary:     Effort: Pulmonary effort is normal.     Breath sounds: No wheezing or rales.  Chest:     Chest wall: Tenderness present.     Comments: Pain along the left chest wall.  No obvious rash. Abdominal:     General: There is no distension.     Palpations: Abdomen is soft.     Tenderness: There is no abdominal tenderness.  Musculoskeletal:        General: No tenderness.  Cervical back: Normal range of motion and neck supple.  Skin:    General: Skin is warm and dry.  Neurological:     Mental Status: She is alert and oriented to person, place, and time.  Psychiatric:        Behavior: Behavior normal.     (all labs ordered are listed, but only abnormal results are displayed) Labs Reviewed  BASIC METABOLIC PANEL WITH GFR - Abnormal; Notable for the following components:      Result Value   Glucose, Bld 114 (*)    All other components within normal limits  CBC - Abnormal; Notable for the following components:   WBC 11.4 (*)    All other components within normal limits  PREGNANCY, URINE  D-DIMER, QUANTITATIVE  TROPONIN T, HIGH SENSITIVITY  TROPONIN T, HIGH SENSITIVITY    EKG: EKG Interpretation Date/Time:  Wednesday March 01 2024 16:19:03 EST Ventricular Rate:  71 PR  Interval:  114 QRS Duration:  84 QT Interval:  366 QTC Calculation: 397 R Axis:   87  Text Interpretation: Normal sinus rhythm Normal ECG No significant change since last tracing Confirmed by Emil Share [phone]) on 03/01/2024 6:56:45 PM  Radiology: DG Chest 2 View Result Date: 03/01/2024 CLINICAL DATA:  Left-sided chest pain radiating to shoulder, short of breath EXAM: DG CHEST 2V COMPARISON:  11/01/2023 FINDINGS: Frontal and lateral views of the chest demonstrate an unremarkable cardiac silhouette. No acute airspace disease, effusion, or pneumothorax. No acute bony abnormalities. IMPRESSION: 1. No acute intrathoracic process. Electronically Signed   By: Ozell Daring M.D.   On: 03/01/2024 17:17     Procedures   Medications Ordered in the ED  morphine  (PF) 4 MG/ML injection 4 mg (4 mg Intravenous Given 03/01/24 1917)  ondansetron  (ZOFRAN ) injection 4 mg (4 mg Intravenous Given 03/01/24 1915)  acetaminophen  (TYLENOL ) tablet 1,000 mg (1,000 mg Oral Given 03/01/24 1918)  ketorolac  (TORADOL ) 15 MG/ML injection 15 mg (15 mg Intravenous Given 03/01/24 1915)                                    Medical Decision Making Amount and/or Complexity of Data Reviewed Labs: ordered. Radiology: ordered.  Risk OTC drugs. Prescription drug management.   48 yo F with a cc of chest pain.  Atypical in nature and reproduced on exam.  Going on for 4 days.  2 troponins are negative D-dimer is negative.  Chest x-ray independently interpreted by me without focal infiltrate pneumothorax.  No significant electrolyte abnormalities no anemia.  Will treat as musculoskeletal.  PCP follow-up.  8:42 PM:  I have discussed the diagnosis/risks/treatment options with the patient.  Evaluation and diagnostic testing in the emergency department does not suggest an emergent condition requiring admission or immediate intervention beyond what has been performed at this time.  They will follow up with PCP. We also discussed  returning to the ED immediately if new or worsening sx occur. We discussed the sx which are most concerning (e.g., sudden worsening pain, fever, inability to tolerate by mouth) that necessitate immediate return. Medications administered to the patient during their visit and any new prescriptions provided to the patient are listed below.  Medications given during this visit Medications  morphine  (PF) 4 MG/ML injection 4 mg (4 mg Intravenous Given 03/01/24 1917)  ondansetron  (ZOFRAN ) injection 4 mg (4 mg Intravenous Given 03/01/24 1915)  acetaminophen  (TYLENOL ) tablet 1,000 mg (1,000 mg Oral Given  03/01/24 1918)  ketorolac  (TORADOL ) 15 MG/ML injection 15 mg (15 mg Intravenous Given 03/01/24 1915)     The patient appears reasonably screen and/or stabilized for discharge and I doubt any other medical condition or other [facility] requiring further screening, evaluation, or treatment in the ED at this time prior to discharge.       Final diagnoses:  Nonspecific chest pain    ED Discharge Orders     None          Emil Share, DO 03/01/24 2042

## 2024-03-01 NOTE — ED Triage Notes (Signed)
 Left side chest pain around to back into shoulder pain. Started 4 days ago worse today Shortness of breath Cough, pain with coughing

## 2024-04-08 ENCOUNTER — Encounter: Payer: Self-pay | Admitting: Physician Assistant

## 2024-04-09 ENCOUNTER — Other Ambulatory Visit: Payer: Self-pay | Admitting: Physician Assistant

## 2024-04-09 MED ORDER — OSELTAMIVIR PHOSPHATE 75 MG PO CAPS: 75.0000 mg | ORAL_CAPSULE | Freq: Every day | ORAL | 0 refills | Status: AC
